# Patient Record
Sex: Female | Born: 1990 | Race: White | Hispanic: No | Marital: Single | Smoking: Never smoker
Health system: Southern US, Community
[De-identification: ages and names within clinical notes are randomized; demographics above are authoritative.]

## PROBLEM LIST (undated history)

## (undated) DIAGNOSIS — F419 Anxiety disorder, unspecified: Secondary | ICD-10-CM

## (undated) DIAGNOSIS — E282 Polycystic ovarian syndrome: Secondary | ICD-10-CM

## (undated) DIAGNOSIS — F329 Major depressive disorder, single episode, unspecified: Secondary | ICD-10-CM

## (undated) DIAGNOSIS — F32A Depression, unspecified: Secondary | ICD-10-CM

## (undated) DIAGNOSIS — E0590 Thyrotoxicosis, unspecified without thyrotoxic crisis or storm: Secondary | ICD-10-CM

## (undated) DIAGNOSIS — E039 Hypothyroidism, unspecified: Secondary | ICD-10-CM

## (undated) HISTORY — PX: ANKLE SURGERY: SHX546

## (undated) HISTORY — DX: Hypothyroidism, unspecified: E03.9

---
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
## 2018-08-06 NOTE — L&D Delivery Note (Signed)
Delivery Note Pt pushed well for 31mins and at 9:59 AM a viable female was delivered via Vaginal, Spontaneous (Presentation: LOA;  ).  APGAR: 9, 9; weight pending  .   Placenta status: delivered intact schultz , .  Cord: 3vc  with the following complications: none.  Cord pH: n/a  Anesthesia:  Epidural Episiotomy: None Lacerations: Periurethral Suture Repair: vicryl 4-0 Est. Blood Loss (mL): 390  Mom to postpartum.  Baby to Couplet care / Skin to Skin  Desires circumcision in office.  Nicole Dyer 05/31/2019, 10:19 AM

## 2018-08-26 DIAGNOSIS — T8484XA Pain due to internal orthopedic prosthetic devices, implants and grafts, initial encounter: Secondary | ICD-10-CM | POA: Insufficient documentation

## 2018-08-26 DIAGNOSIS — S82853D Displaced trimalleolar fracture of unspecified lower leg, subsequent encounter for closed fracture with routine healing: Secondary | ICD-10-CM | POA: Insufficient documentation

## 2018-12-01 LAB — OB RESULTS CONSOLE GC/CHLAMYDIA
Chlamydia: NEGATIVE
Gonorrhea: NEGATIVE

## 2018-12-01 LAB — OB RESULTS CONSOLE ANTIBODY SCREEN: Antibody Screen: NEGATIVE

## 2018-12-01 LAB — OB RESULTS CONSOLE ABO/RH: RH Type: POSITIVE

## 2018-12-01 LAB — OB RESULTS CONSOLE HEPATITIS B SURFACE ANTIGEN: Hepatitis B Surface Ag: NEGATIVE

## 2018-12-01 LAB — OB RESULTS CONSOLE RUBELLA ANTIBODY, IGM: Rubella: IMMUNE

## 2018-12-01 LAB — OB RESULTS CONSOLE RPR: RPR: NONREACTIVE

## 2018-12-01 LAB — OB RESULTS CONSOLE HIV ANTIBODY (ROUTINE TESTING): HIV: NONREACTIVE

## 2018-12-31 DIAGNOSIS — E282 Polycystic ovarian syndrome: Secondary | ICD-10-CM | POA: Diagnosis not present

## 2018-12-31 DIAGNOSIS — O99341 Other mental disorders complicating pregnancy, first trimester: Secondary | ICD-10-CM | POA: Diagnosis not present

## 2018-12-31 DIAGNOSIS — E039 Hypothyroidism, unspecified: Secondary | ICD-10-CM | POA: Diagnosis not present

## 2018-12-31 DIAGNOSIS — O99342 Other mental disorders complicating pregnancy, second trimester: Secondary | ICD-10-CM | POA: Diagnosis not present

## 2018-12-31 DIAGNOSIS — R87610 Atypical squamous cells of undetermined significance on cytologic smear of cervix (ASC-US): Secondary | ICD-10-CM | POA: Diagnosis not present

## 2018-12-31 DIAGNOSIS — Z3A18 18 weeks gestation of pregnancy: Secondary | ICD-10-CM | POA: Diagnosis not present

## 2019-01-01 DIAGNOSIS — F4323 Adjustment disorder with mixed anxiety and depressed mood: Secondary | ICD-10-CM | POA: Diagnosis not present

## 2019-01-08 DIAGNOSIS — F4323 Adjustment disorder with mixed anxiety and depressed mood: Secondary | ICD-10-CM | POA: Diagnosis not present

## 2019-01-08 DIAGNOSIS — Z363 Encounter for antenatal screening for malformations: Secondary | ICD-10-CM | POA: Diagnosis not present

## 2019-01-08 DIAGNOSIS — Z3A19 19 weeks gestation of pregnancy: Secondary | ICD-10-CM | POA: Diagnosis not present

## 2019-01-14 DIAGNOSIS — F410 Panic disorder [episodic paroxysmal anxiety] without agoraphobia: Secondary | ICD-10-CM | POA: Diagnosis not present

## 2019-01-23 DIAGNOSIS — F4323 Adjustment disorder with mixed anxiety and depressed mood: Secondary | ICD-10-CM | POA: Diagnosis not present

## 2019-01-29 DIAGNOSIS — F4323 Adjustment disorder with mixed anxiety and depressed mood: Secondary | ICD-10-CM | POA: Diagnosis not present

## 2019-02-04 DIAGNOSIS — Z362 Encounter for other antenatal screening follow-up: Secondary | ICD-10-CM | POA: Diagnosis not present

## 2019-02-04 DIAGNOSIS — Z3A23 23 weeks gestation of pregnancy: Secondary | ICD-10-CM | POA: Diagnosis not present

## 2019-02-13 DIAGNOSIS — F4323 Adjustment disorder with mixed anxiety and depressed mood: Secondary | ICD-10-CM | POA: Diagnosis not present

## 2019-02-26 DIAGNOSIS — F4323 Adjustment disorder with mixed anxiety and depressed mood: Secondary | ICD-10-CM | POA: Diagnosis not present

## 2019-03-04 DIAGNOSIS — Z23 Encounter for immunization: Secondary | ICD-10-CM | POA: Diagnosis not present

## 2019-03-04 DIAGNOSIS — Z3689 Encounter for other specified antenatal screening: Secondary | ICD-10-CM | POA: Diagnosis not present

## 2019-03-04 DIAGNOSIS — O99282 Endocrine, nutritional and metabolic diseases complicating pregnancy, second trimester: Secondary | ICD-10-CM | POA: Diagnosis not present

## 2019-03-04 DIAGNOSIS — Z3A27 27 weeks gestation of pregnancy: Secondary | ICD-10-CM | POA: Diagnosis not present

## 2019-03-04 DIAGNOSIS — O99342 Other mental disorders complicating pregnancy, second trimester: Secondary | ICD-10-CM | POA: Diagnosis not present

## 2019-03-11 DIAGNOSIS — F410 Panic disorder [episodic paroxysmal anxiety] without agoraphobia: Secondary | ICD-10-CM | POA: Diagnosis not present

## 2019-03-19 DIAGNOSIS — Z3A29 29 weeks gestation of pregnancy: Secondary | ICD-10-CM | POA: Diagnosis not present

## 2019-03-19 DIAGNOSIS — R5381 Other malaise: Secondary | ICD-10-CM | POA: Diagnosis not present

## 2019-03-19 DIAGNOSIS — Z3403 Encounter for supervision of normal first pregnancy, third trimester: Secondary | ICD-10-CM | POA: Diagnosis not present

## 2019-03-19 DIAGNOSIS — N898 Other specified noninflammatory disorders of vagina: Secondary | ICD-10-CM | POA: Diagnosis not present

## 2019-03-24 DIAGNOSIS — F410 Panic disorder [episodic paroxysmal anxiety] without agoraphobia: Secondary | ICD-10-CM | POA: Diagnosis not present

## 2019-03-31 DIAGNOSIS — F410 Panic disorder [episodic paroxysmal anxiety] without agoraphobia: Secondary | ICD-10-CM | POA: Diagnosis not present

## 2019-04-01 DIAGNOSIS — E041 Nontoxic single thyroid nodule: Secondary | ICD-10-CM | POA: Diagnosis not present

## 2019-04-01 DIAGNOSIS — R946 Abnormal results of thyroid function studies: Secondary | ICD-10-CM | POA: Diagnosis not present

## 2019-04-02 DIAGNOSIS — E042 Nontoxic multinodular goiter: Secondary | ICD-10-CM | POA: Diagnosis not present

## 2019-04-06 DIAGNOSIS — F410 Panic disorder [episodic paroxysmal anxiety] without agoraphobia: Secondary | ICD-10-CM | POA: Diagnosis not present

## 2019-04-13 DIAGNOSIS — F410 Panic disorder [episodic paroxysmal anxiety] without agoraphobia: Secondary | ICD-10-CM | POA: Diagnosis not present

## 2019-04-22 DIAGNOSIS — F410 Panic disorder [episodic paroxysmal anxiety] without agoraphobia: Secondary | ICD-10-CM | POA: Diagnosis not present

## 2019-04-29 DIAGNOSIS — F410 Panic disorder [episodic paroxysmal anxiety] without agoraphobia: Secondary | ICD-10-CM | POA: Diagnosis not present

## 2019-05-04 ENCOUNTER — Encounter (HOSPITAL_COMMUNITY): Payer: Self-pay | Admitting: *Deleted

## 2019-05-04 ENCOUNTER — Inpatient Hospital Stay (HOSPITAL_COMMUNITY)
Admission: AD | Admit: 2019-05-04 | Discharge: 2019-05-04 | Disposition: A | Discharge status: Home / Self Care | Payer: Medicaid Other | Attending: Obstetrics and Gynecology | Admitting: Obstetrics and Gynecology

## 2019-05-04 ENCOUNTER — Other Ambulatory Visit: Payer: Self-pay

## 2019-05-04 DIAGNOSIS — R197 Diarrhea, unspecified: Secondary | ICD-10-CM

## 2019-05-04 DIAGNOSIS — Z711 Person with feared health complaint in whom no diagnosis is made: Secondary | ICD-10-CM | POA: Diagnosis not present

## 2019-05-04 DIAGNOSIS — O219 Vomiting of pregnancy, unspecified: Secondary | ICD-10-CM

## 2019-05-04 DIAGNOSIS — O212 Late vomiting of pregnancy: Secondary | ICD-10-CM | POA: Insufficient documentation

## 2019-05-04 DIAGNOSIS — Z3A35 35 weeks gestation of pregnancy: Secondary | ICD-10-CM | POA: Diagnosis not present

## 2019-05-04 DIAGNOSIS — M7989 Other specified soft tissue disorders: Secondary | ICD-10-CM | POA: Diagnosis not present

## 2019-05-04 DIAGNOSIS — O9989 Other specified diseases and conditions complicating pregnancy, childbirth and the puerperium: Secondary | ICD-10-CM | POA: Diagnosis not present

## 2019-05-04 DIAGNOSIS — O26893 Other specified pregnancy related conditions, third trimester: Secondary | ICD-10-CM | POA: Diagnosis not present

## 2019-05-04 DIAGNOSIS — O1203 Gestational edema, third trimester: Secondary | ICD-10-CM

## 2019-05-04 HISTORY — DX: Anxiety disorder, unspecified: F41.9

## 2019-05-04 HISTORY — DX: Depression, unspecified: F32.A

## 2019-05-04 HISTORY — DX: Polycystic ovarian syndrome: E28.2

## 2019-05-04 LAB — URINALYSIS, ROUTINE W REFLEX MICROSCOPIC
Bilirubin Urine: NEGATIVE
Glucose, UA: NEGATIVE mg/dL
Hgb urine dipstick: NEGATIVE
Ketones, ur: NEGATIVE mg/dL
Nitrite: NEGATIVE
Protein, ur: NEGATIVE mg/dL
Specific Gravity, Urine: 1.013 (ref 1.005–1.030)
pH: 7 (ref 5.0–8.0)

## 2019-05-04 MED ORDER — PROMETHAZINE HCL 25 MG PO TABS: 25.0000 mg | ORAL_TABLET | Freq: Four times a day (QID) | ORAL | 1 refills | Status: DC | PRN

## 2019-05-04 NOTE — Discharge Instructions (Signed)

## 2019-05-04 NOTE — MAU Note (Signed)
Unable to keep food down that she was able to keep down before. Throwing up between 430-530am this morning.

## 2019-05-04 NOTE — MAU Provider Note (Signed)
History     CSN: [id number]  Arrival date and time: 05/04/19 1630   First Provider Initiated Contact with Patient 05/04/19 1819      Chief Complaint  Patient presents with  . Foot Swelling  . Emesis   HPI   Ms.Nicole Dyer is a 28 y.o.female G1P0 @[redacted]w[redacted]d  here with a month of increased in N/V. Says several times over the last month she has vomited everything up first thing in the morning. She had 1 episode of diarrhea today and 1 episode of diarrhea on Saturday. No fever, no vaginal bleeding.  Has taken phenergan in the past for the nausea however she ran out. This is her first baby and at times she is unsure what is normal and what is concerning.   OB History    Gravida  1   Para      Term      Preterm      AB      Living        SAB      TAB      Ectopic      Multiple      Live Births              Past Medical History:  Diagnosis Date  . Anxiety   . Depression   . PCOS (polycystic ovarian syndrome)     Past Surgical History:  Procedure Laterality Date  . ANKLE SURGERY Left     Family History  Problem Relation Age of Onset  . Cancer Mother     Social History   Tobacco Use  . Smoking status: Never Smoker  . Smokeless tobacco: Never Used  Substance Use Topics  . Alcohol use: Never    Frequency: Never  . Drug use: Yes    Frequency: 3.0 times per week    Types: Marijuana    Allergies:  Allergies  Allergen Reactions  . Bee Venom Swelling  . Poison Ivy Extract Swelling    No medications prior to admission.   Results for orders placed or performed during the hospital encounter of 05/04/19 (from the past 48 hour(s))  Urinalysis, Routine w reflex microscopic     Status: Abnormal   Collection Time: 05/04/19  5:05 PM  Result Value Ref Range   Color, Urine YELLOW YELLOW   APPearance CLEAR CLEAR   Specific Gravity, Urine 1.013 1.005 - 1.030   pH 7.0 5.0 - 8.0   Glucose, UA NEGATIVE NEGATIVE mg/dL   Hgb urine dipstick NEGATIVE  NEGATIVE   Bilirubin Urine NEGATIVE NEGATIVE   Ketones, ur NEGATIVE NEGATIVE mg/dL   Protein, ur NEGATIVE NEGATIVE mg/dL   Nitrite NEGATIVE NEGATIVE   Leukocytes,Ua MODERATE (A) NEGATIVE   RBC / HPF 0-5 0 - 5 RBC/hpf   WBC, UA 0-5 0 - 5 WBC/hpf   Bacteria, UA RARE (A) NONE SEEN   Squamous Epithelial / LPF 0-5 0 - 5   Mucus PRESENT     Comment: Performed at [facility], [address], [place], [place]    Review of Systems  Constitutional: Negative for fever.  Gastrointestinal: Positive for nausea (None now) and vomiting. Negative for abdominal pain.   Physical Exam   Blood pressure (!) 102/58, pulse 89, temperature 98.2 F (36.8 C), temperature source Oral, resp. rate 18, height 5\' 10"  (1.778 m), weight 103 kg, SpO2 99 %.  Physical Exam  Constitutional: She is oriented to person, place, and time. She appears well-developed and well-nourished.  No distress.  HENT:  Head: Normocephalic.  Musculoskeletal: Normal range of motion.        General: Edema (Mild swelling +1 in bilateral lower extermities. ) present.  Neurological: She is alert and oriented to person, place, and time. She has normal reflexes. She displays normal reflexes.  Negative clonus   Skin: She is not diaphoretic.  Psychiatric: Her behavior is normal.   Fetal Tracing: Baseline: 120 bpm Variability: Moderate  Accelerations: 15x15 Decelerations: None Toco: None  MAU Course  Procedures  None  MDM  Well appearing female. BP normal. Mild swelling in extremities.   Assessment and Plan   A:  1. Nausea and vomiting in pregnancy   2. Physically well but worried   3. [redacted] weeks gestation of pregnancy   4. Diarrhea, unspecified type   5. Edema during pregnancy in third trimester     P:  Discharge home in stable condition Small, frequent meals Elevate feet at night Increase oral fluid intake Urine culture pending Rx: Phenergan F/u with OB  Lezlie Lye, NP 05/04/2019 8:17  PM

## 2019-05-04 NOTE — MAU Note (Signed)
Last 3 or 4 days, has noted an increase in swelling.  Waking up with swelling. Threw up yesterday morning after breakfast.  Started throwing up during the night, not since this morning, has been eating light since then.  Has been having loose stools last couple days. Called office, sent in for eval.  Denies HA, has been having floaters past few wks, denies epigastric pain.   Noted her tongue was yellow 2 wks ago.  Noted white spots on her arms.

## 2019-05-05 DIAGNOSIS — F410 Panic disorder [episodic paroxysmal anxiety] without agoraphobia: Secondary | ICD-10-CM | POA: Diagnosis not present

## 2019-05-06 LAB — CULTURE, OB URINE: Special Requests: NORMAL

## 2019-05-11 DIAGNOSIS — Z3A36 36 weeks gestation of pregnancy: Secondary | ICD-10-CM | POA: Diagnosis not present

## 2019-05-11 DIAGNOSIS — E079 Disorder of thyroid, unspecified: Secondary | ICD-10-CM | POA: Diagnosis not present

## 2019-05-11 DIAGNOSIS — Z3685 Encounter for antenatal screening for Streptococcus B: Secondary | ICD-10-CM | POA: Diagnosis not present

## 2019-05-11 DIAGNOSIS — O99343 Other mental disorders complicating pregnancy, third trimester: Secondary | ICD-10-CM | POA: Diagnosis not present

## 2019-05-11 DIAGNOSIS — E282 Polycystic ovarian syndrome: Secondary | ICD-10-CM | POA: Diagnosis not present

## 2019-05-11 LAB — OB RESULTS CONSOLE GBS: GBS: POSITIVE

## 2019-05-13 DIAGNOSIS — F410 Panic disorder [episodic paroxysmal anxiety] without agoraphobia: Secondary | ICD-10-CM | POA: Diagnosis not present

## 2019-05-20 DIAGNOSIS — O1200 Gestational edema, unspecified trimester: Secondary | ICD-10-CM | POA: Diagnosis not present

## 2019-05-27 DIAGNOSIS — F410 Panic disorder [episodic paroxysmal anxiety] without agoraphobia: Secondary | ICD-10-CM | POA: Diagnosis not present

## 2019-05-28 ENCOUNTER — Telehealth (HOSPITAL_COMMUNITY): Payer: Self-pay | Admitting: *Deleted

## 2019-05-28 ENCOUNTER — Encounter (HOSPITAL_COMMUNITY): Payer: Self-pay | Admitting: *Deleted

## 2019-05-28 NOTE — Telephone Encounter (Signed)
Preadmission screen  

## 2019-05-30 ENCOUNTER — Inpatient Hospital Stay (HOSPITAL_COMMUNITY)
Admission: AD | Admit: 2019-05-30 | Discharge: 2019-06-02 | DRG: 807 | Disposition: A | Discharge status: Home / Self Care | Payer: Medicaid Other | Attending: Obstetrics and Gynecology | Admitting: Obstetrics and Gynecology

## 2019-05-30 ENCOUNTER — Other Ambulatory Visit: Payer: Self-pay

## 2019-05-30 ENCOUNTER — Encounter (HOSPITAL_COMMUNITY): Payer: Self-pay | Admitting: *Deleted

## 2019-05-30 DIAGNOSIS — O99820 Streptococcus B carrier state complicating pregnancy: Principal | ICD-10-CM | POA: Diagnosis present

## 2019-05-30 DIAGNOSIS — Z3A39 39 weeks gestation of pregnancy: Secondary | ICD-10-CM

## 2019-05-30 DIAGNOSIS — Z23 Encounter for immunization: Secondary | ICD-10-CM

## 2019-05-30 DIAGNOSIS — Z141 Cystic fibrosis carrier: Secondary | ICD-10-CM

## 2019-05-30 DIAGNOSIS — Z20828 Contact with and (suspected) exposure to other viral communicable diseases: Secondary | ICD-10-CM | POA: Diagnosis present

## 2019-05-30 MED ORDER — LACTATED RINGERS IV SOLN
500.0000 mL | INTRAVENOUS | Status: DC | PRN
  Administered 2019-05-30: 1000 mL via INTRAVENOUS

## 2019-05-30 NOTE — MAU Note (Signed)
Pt reports to MAU c/o SROM @1900 . Pt states her ctxs are 6-7 min apart. +FM. Pt reports a pink tinge to her amniotic fluid.

## 2019-05-31 ENCOUNTER — Inpatient Hospital Stay (HOSPITAL_COMMUNITY): Payer: Medicaid Other | Admitting: Anesthesiology

## 2019-05-31 ENCOUNTER — Encounter (HOSPITAL_COMMUNITY): Payer: Self-pay

## 2019-05-31 DIAGNOSIS — O26893 Other specified pregnancy related conditions, third trimester: Secondary | ICD-10-CM | POA: Diagnosis not present

## 2019-05-31 DIAGNOSIS — Z3A39 39 weeks gestation of pregnancy: Secondary | ICD-10-CM | POA: Diagnosis not present

## 2019-05-31 DIAGNOSIS — O411230 Chorioamnionitis, third trimester, not applicable or unspecified: Secondary | ICD-10-CM | POA: Diagnosis not present

## 2019-05-31 DIAGNOSIS — O99284 Endocrine, nutritional and metabolic diseases complicating childbirth: Secondary | ICD-10-CM | POA: Diagnosis not present

## 2019-05-31 DIAGNOSIS — Z23 Encounter for immunization: Secondary | ICD-10-CM | POA: Diagnosis not present

## 2019-05-31 DIAGNOSIS — Z3A00 Weeks of gestation of pregnancy not specified: Secondary | ICD-10-CM | POA: Diagnosis not present

## 2019-05-31 DIAGNOSIS — Z141 Cystic fibrosis carrier: Secondary | ICD-10-CM | POA: Diagnosis not present

## 2019-05-31 DIAGNOSIS — E039 Hypothyroidism, unspecified: Secondary | ICD-10-CM | POA: Diagnosis not present

## 2019-05-31 DIAGNOSIS — O99820 Streptococcus B carrier state complicating pregnancy: Secondary | ICD-10-CM | POA: Diagnosis not present

## 2019-05-31 DIAGNOSIS — Z20828 Contact with and (suspected) exposure to other viral communicable diseases: Secondary | ICD-10-CM | POA: Diagnosis not present

## 2019-05-31 LAB — CBC
HCT: 33.9 % — ABNORMAL LOW (ref 36.0–46.0)
Hemoglobin: 11.3 g/dL — ABNORMAL LOW (ref 12.0–15.0)
MCH: 29.4 pg (ref 26.0–34.0)
MCHC: 33.3 g/dL (ref 30.0–36.0)
MCV: 88.3 fL (ref 80.0–100.0)
Platelets: 236 10*3/uL (ref 150–400)
RBC: 3.84 MIL/uL — ABNORMAL LOW (ref 3.87–5.11)
RDW: 13.3 % (ref 11.5–15.5)
WBC: 12.3 10*3/uL — ABNORMAL HIGH (ref 4.0–10.5)
nRBC: 0 % (ref 0.0–0.2)

## 2019-05-31 LAB — ABO/RH: ABO/RH(D): O POS

## 2019-05-31 LAB — TYPE AND SCREEN
ABO/RH(D): O POS
Antibody Screen: NEGATIVE

## 2019-05-31 LAB — SARS CORONAVIRUS 2 BY RT PCR (HOSPITAL ORDER, PERFORMED IN ~~LOC~~ HOSPITAL LAB): SARS Coronavirus 2: NEGATIVE

## 2019-05-31 LAB — RPR: RPR Ser Ql: NONREACTIVE

## 2019-05-31 MED ORDER — BUTORPHANOL TARTRATE 1 MG/ML IJ SOLN
1.0000 mg | INTRAMUSCULAR | Status: DC | PRN
  Administered 2019-05-31: 03:00:00 1 mg via INTRAVENOUS

## 2019-05-31 MED ORDER — EPHEDRINE 5 MG/ML INJ: 10.0000 mg | INTRAVENOUS | Status: DC | PRN

## 2019-05-31 MED ORDER — OXYCODONE HCL 5 MG PO TABS: 5.0000 mg | ORAL_TABLET | ORAL | Status: DC | PRN

## 2019-05-31 MED ORDER — TETANUS-DIPHTH-ACELL PERTUSSIS 5-2.5-18.5 LF-MCG/0.5 IM SUSP: 0.5000 mL | Freq: Once | INTRAMUSCULAR | Status: DC

## 2019-05-31 MED ORDER — ONDANSETRON HCL 4 MG/2ML IJ SOLN: 4.0000 mg | INTRAMUSCULAR | Status: DC | PRN

## 2019-05-31 MED ORDER — SENNOSIDES-DOCUSATE SODIUM 8.6-50 MG PO TABS
2.0000 | ORAL_TABLET | ORAL | Status: DC
  Administered 2019-06-01 (×2): 2 via ORAL
  Filled 2019-05-31 (×2): qty 2

## 2019-05-31 MED ORDER — DIPHENHYDRAMINE HCL 25 MG PO CAPS: 25.0000 mg | ORAL_CAPSULE | Freq: Four times a day (QID) | ORAL | Status: DC | PRN

## 2019-05-31 MED ORDER — LIDOCAINE HCL (PF) 1 % IJ SOLN
INTRAMUSCULAR | Status: DC | PRN
  Administered 2019-05-31 (×2): 6 mL via EPIDURAL

## 2019-05-31 MED ORDER — LACTATED RINGERS IV SOLN
500.0000 mL | Freq: Once | INTRAVENOUS | Status: AC
  Administered 2019-05-31: 500 mL via INTRAVENOUS

## 2019-05-31 MED ORDER — OXYCODONE-ACETAMINOPHEN 5-325 MG PO TABS: 2.0000 | ORAL_TABLET | ORAL | Status: DC | PRN

## 2019-05-31 MED ORDER — FLEET ENEMA 7-19 GM/118ML RE ENEM: 1.0000 | ENEMA | RECTAL | Status: DC | PRN

## 2019-05-31 MED ORDER — LACTATED RINGERS IV SOLN
INTRAVENOUS | Status: DC
  Administered 2019-05-31: via INTRAVENOUS

## 2019-05-31 MED ORDER — WITCH HAZEL-GLYCERIN EX PADS
1.0000 "application " | MEDICATED_PAD | CUTANEOUS | Status: DC | PRN
  Administered 2019-06-01: 1 via TOPICAL

## 2019-05-31 MED ORDER — INFLUENZA VAC SPLIT QUAD 0.5 ML IM SUSY
0.5000 mL | PREFILLED_SYRINGE | INTRAMUSCULAR | Status: AC
  Administered 2019-06-01: 09:00:00 0.5 mL via INTRAMUSCULAR
  Filled 2019-05-31: qty 0.5

## 2019-05-31 MED ORDER — COCONUT OIL OIL
1.0000 "application " | TOPICAL_OIL | Status: DC | PRN
  Administered 2019-06-02: 1 via TOPICAL

## 2019-05-31 MED ORDER — ACETAMINOPHEN 325 MG PO TABS
650.0000 mg | ORAL_TABLET | ORAL | Status: DC | PRN
  Administered 2019-05-31: 650 mg via ORAL
  Filled 2019-05-31: qty 2

## 2019-05-31 MED ORDER — LIDOCAINE HCL (PF) 1 % IJ SOLN: 30.0000 mL | INTRAMUSCULAR | Status: DC | PRN

## 2019-05-31 MED ORDER — DIPHENHYDRAMINE HCL 50 MG/ML IJ SOLN: 12.5000 mg | INTRAMUSCULAR | Status: DC | PRN

## 2019-05-31 MED ORDER — OXYTOCIN BOLUS FROM INFUSION
500.0000 mL | Freq: Once | INTRAVENOUS | Status: AC
  Administered 2019-05-31: 500 mL via INTRAVENOUS

## 2019-05-31 MED ORDER — ONDANSETRON HCL 4 MG PO TABS: 4.0000 mg | ORAL_TABLET | ORAL | Status: DC | PRN

## 2019-05-31 MED ORDER — FENTANYL-BUPIVACAINE-NACL 0.5-0.125-0.9 MG/250ML-% EP SOLN
12.0000 mL/h | EPIDURAL | Status: DC | PRN
  Filled 2019-05-31: qty 250

## 2019-05-31 MED ORDER — DIBUCAINE (PERIANAL) 1 % EX OINT: 1.0000 "application " | TOPICAL_OINTMENT | CUTANEOUS | Status: DC | PRN

## 2019-05-31 MED ORDER — SODIUM CHLORIDE 0.9 % IV SOLN
5.0000 10*6.[IU] | Freq: Once | INTRAVENOUS | Status: AC
  Administered 2019-05-31: 02:00:00 5 10*6.[IU] via INTRAVENOUS
  Filled 2019-05-31: qty 5

## 2019-05-31 MED ORDER — OXYCODONE-ACETAMINOPHEN 5-325 MG PO TABS: 1.0000 | ORAL_TABLET | ORAL | Status: DC | PRN

## 2019-05-31 MED ORDER — SIMETHICONE 80 MG PO CHEW: 80.0000 mg | CHEWABLE_TABLET | ORAL | Status: DC | PRN

## 2019-05-31 MED ORDER — SODIUM CHLORIDE (PF) 0.9 % IJ SOLN
INTRAMUSCULAR | Status: DC | PRN
  Administered 2019-05-31: 12 mL/h via EPIDURAL

## 2019-05-31 MED ORDER — PENICILLIN G 3 MILLION UNITS IVPB - SIMPLE MED
3.0000 10*6.[IU] | INTRAVENOUS | Status: DC
  Administered 2019-05-31 (×2): 3 10*6.[IU] via INTRAVENOUS
  Filled 2019-05-31 (×2): qty 100

## 2019-05-31 MED ORDER — IBUPROFEN 600 MG PO TABS
600.0000 mg | ORAL_TABLET | Freq: Four times a day (QID) | ORAL | Status: DC
  Administered 2019-05-31 – 2019-06-02 (×8): 600 mg via ORAL
  Filled 2019-05-31 (×8): qty 1

## 2019-05-31 MED ORDER — ONDANSETRON HCL 4 MG/2ML IJ SOLN: 4.0000 mg | Freq: Four times a day (QID) | INTRAMUSCULAR | Status: DC | PRN

## 2019-05-31 MED ORDER — PHENYLEPHRINE 40 MCG/ML (10ML) SYRINGE FOR IV PUSH (FOR BLOOD PRESSURE SUPPORT)
80.0000 ug | PREFILLED_SYRINGE | INTRAVENOUS | Status: DC | PRN
  Filled 2019-05-31: qty 10

## 2019-05-31 MED ORDER — SOD CITRATE-CITRIC ACID 500-334 MG/5ML PO SOLN
30.0000 mL | ORAL | Status: DC | PRN
  Administered 2019-05-31: 30 mL via ORAL
  Filled 2019-05-31: qty 30

## 2019-05-31 MED ORDER — BUTORPHANOL TARTRATE 1 MG/ML IJ SOLN
INTRAMUSCULAR | Status: AC
  Administered 2019-05-31: 1 mg via INTRAVENOUS
  Filled 2019-05-31: qty 1

## 2019-05-31 MED ORDER — PRENATAL MULTIVITAMIN CH
1.0000 | ORAL_TABLET | Freq: Every day | ORAL | Status: DC
  Administered 2019-06-01 – 2019-06-02 (×2): 1 via ORAL
  Filled 2019-05-31 (×2): qty 1

## 2019-05-31 MED ORDER — ACETAMINOPHEN 325 MG PO TABS: 650.0000 mg | ORAL_TABLET | ORAL | Status: DC | PRN

## 2019-05-31 MED ORDER — OXYTOCIN 40 UNITS IN NORMAL SALINE INFUSION - SIMPLE MED
2.5000 [IU]/h | INTRAVENOUS | Status: DC
  Filled 2019-05-31: qty 1000

## 2019-05-31 MED ORDER — OXYTOCIN 40 UNITS IN NORMAL SALINE INFUSION - SIMPLE MED
1.0000 m[IU]/min | INTRAVENOUS | Status: DC
  Administered 2019-05-31: 06:00:00 2 m[IU]/min via INTRAVENOUS

## 2019-05-31 MED ORDER — TERBUTALINE SULFATE 1 MG/ML IJ SOLN: 0.2500 mg | Freq: Once | INTRAMUSCULAR | Status: DC | PRN

## 2019-05-31 MED ORDER — ZOLPIDEM TARTRATE 5 MG PO TABS: 5.0000 mg | ORAL_TABLET | Freq: Every evening | ORAL | Status: DC | PRN

## 2019-05-31 MED ORDER — BENZOCAINE-MENTHOL 20-0.5 % EX AERO
1.0000 "application " | INHALATION_SPRAY | CUTANEOUS | Status: DC | PRN
  Administered 2019-05-31: 1 via TOPICAL
  Filled 2019-05-31: qty 56

## 2019-05-31 MED ORDER — OXYCODONE HCL 5 MG PO TABS: 10.0000 mg | ORAL_TABLET | ORAL | Status: DC | PRN

## 2019-05-31 MED ORDER — PHENYLEPHRINE 40 MCG/ML (10ML) SYRINGE FOR IV PUSH (FOR BLOOD PRESSURE SUPPORT): 80.0000 ug | PREFILLED_SYRINGE | INTRAVENOUS | Status: DC | PRN

## 2019-05-31 NOTE — H&P (Signed)
Nicole Dyer is a 28 y.o.prime female who presented with spontaneous ruptured membranes at 26 5/7wks. Pt was having some contractions as well - 1-2 cm dilated. Pt is dated per LMP, confirmed with a 13 week [place]. She has a hisotry of thyroid disease - no meds needed in pregnancy- saw endocrinologist. She has been seeing a counselor for anxiety during pregnancy. She is GBS+. She was CF + OB History    Gravida  1   Para      Term      Preterm      AB      Living        SAB      TAB      Ectopic      Multiple      Live Births             Past Medical History:  Diagnosis Date  . Anxiety   . Depression   . Hypothyroidism   . PCOS (polycystic ovarian syndrome)    Past Surgical History:  Procedure Laterality Date  . ANKLE SURGERY Left    Family History: family history includes Cancer in her mother. Social History:  reports that she has never smoked. She has never used smokeless tobacco. She reports current drug use. Frequency: 3.00 times per week. Drug: Marijuana. She reports that she does not drink alcohol.     Maternal Diabetes: No Genetic Screening: Abnormal:  Results: Other: CF + Maternal Ultrasounds/Referrals: Normal Fetal Ultrasounds or other Referrals:  None Maternal Substance Abuse:  No Significant Maternal Medications:  None Significant Maternal Lab Results:  Group B Strep positive Other Comments:  None  Review of Systems  Constitutional: Negative for chills, fever, malaise/fatigue and weight loss.  Eyes: Negative for blurred vision and double vision.  Respiratory: Negative for shortness of breath.   Cardiovascular: Negative for chest pain and palpitations.  Gastrointestinal: Positive for abdominal pain. Negative for heartburn, nausea and vomiting.  Genitourinary: Negative for dysuria.  Musculoskeletal: Negative for myalgias.  Skin: Negative for itching and rash.  Neurological: Negative for dizziness and headaches.  Endo/Heme/Allergies: Negative for  environmental allergies. Does not bruise/bleed easily.  Psychiatric/Behavioral: Negative for depression, hallucinations, substance abuse and suicidal ideas. The patient is not nervous/anxious.    Maternal Medical History:  Reason for admission: Rupture of membranes and contractions.  Nausea.  Contractions: Onset was 6-12 hours ago.   Frequency: regular.   Perceived severity is strong.    Fetal activity: Perceived fetal activity is normal.   Last perceived fetal movement was within the past hour.    Prenatal complications: no prenatal complications Prenatal Complications - Diabetes: none.    Dilation: 4 Effacement (%): 80 Station: -1 Exam by:: Meghan Rice, RN Blood pressure 110/67, pulse 88, temperature 99.1 F (37.3 C), temperature source Oral, resp. rate 16, height 5\' 10"  (1.778 m), weight 106 kg, SpO2 98 %. Maternal Exam:  Uterine Assessment: Contraction strength is moderate.  Contraction frequency is regular.   Abdomen: Patient reports generalized tenderness.  Estimated fetal weight is AGA.   Fetal presentation: vertex  Introitus: Vulva is negative for condylomata and lesion.  Vagina is negative for condylomata.  Amniotic fluid character: clear.  Pelvis: adequate for delivery.   Cervix: Cervix evaluated by digital exam.     Fetal Exam Fetal Monitor Review: Baseline rate: 120.  Variability: moderate (6-25 bpm).   Pattern: accelerations present.    Fetal State Assessment: Category I - tracings are normal.     Physical  Exam  Constitutional: She is oriented to person, place, and time. She appears well-developed and well-nourished.  Neck: Normal range of motion.  Cardiovascular: Normal rate.  Respiratory: Effort normal.  GI: Soft. There is generalized abdominal tenderness.  Genitourinary:    Vagina and uterus normal.     No vulval condylomata or lesion noted.   Musculoskeletal: Normal range of motion.  Neurological: She is alert and oriented to person, place,  and time.  Skin: Skin is warm.  Psychiatric: She has a normal mood and affect. Her behavior is normal. Judgment and thought content normal.    Prenatal labs: ABO, Rh: --/--/O POS, O POS Performed at [facility], [address] [place], [place] [address]  [phone]) Antibody: NEG (10/24 2351) Rubella: Immune (04/27 0000) RPR: Nonreactive (04/27 0000)  HBsAg: Negative (04/27 0000)  HIV: Non-reactive (04/27 0000)  GBS: Positive/-- (10/05 0000)   Assessment/Plan: Prime at 69 5/7wks with SROM and active labor Admit Labor augmentation with pitocin  Epidural prn prn GBS - treated with PCN Pt now complete - will attempt pushing   Venetia Night Banga 05/31/2019, 9:05 AM

## 2019-05-31 NOTE — Anesthesia Procedure Notes (Signed)
Epidural Patient location during procedure: OB Start time: 05/31/2019 4:31 AM End time: 05/31/2019 4:34 AM  Staffing Anesthesiologist: Lyn Hollingshead, MD Performed: anesthesiologist   Preanesthetic Checklist Completed: patient identified, site marked, surgical consent, pre-op evaluation, timeout performed, IV checked, risks and benefits discussed and monitors and equipment checked  Epidural Patient position: sitting Prep: site prepped and draped and DuraPrep Patient monitoring: continuous pulse ox and blood pressure Approach: midline Location: L3-L4 Injection technique: LOR air  Needle:  Needle type: Tuohy  Needle gauge: 17 G Needle length: 9 cm and 9 Needle insertion depth: 6 cm Catheter type: closed end flexible Catheter size: 19 Gauge Catheter at skin depth: 11 cm Test dose: negative and Other  Assessment Events: blood not aspirated, injection not painful, no injection resistance, negative IV test and no paresthesia  Additional Notes Reason for block:procedure for pain

## 2019-05-31 NOTE — Progress Notes (Signed)
Patient ID: Nicole Dyer, female   DOB: 1991/04/04, 28 y.o.   MRN: [id number] Notified pt with temp of 100.7 during recovery  Pt asymptomatic  Will give a dose of tylenol now and request placenta to pathology.   Monitor for now If persists will consider antibiotic treatment

## 2019-05-31 NOTE — Anesthesia Postprocedure Evaluation (Signed)
Anesthesia Post Note  Patient: Nicole Dyer  Procedure(s) Performed: AN AD [place]     Patient location during evaluation: Mother Baby Anesthesia Type: Epidural Level of consciousness: awake and alert Pain management: pain level controlled Vital Signs Assessment: post-procedure vital signs reviewed and stable Respiratory status: spontaneous breathing, nonlabored ventilation and respiratory function stable Cardiovascular status: stable Postop Assessment: no headache, no backache and epidural receding Anesthetic complications: no    Last Vitals:  Vitals:   05/31/19 1236 05/31/19 1335  BP: 108/66 117/70  Pulse: 86 87  Resp: 20 19  Temp: 37.3 C 37.2 C  SpO2: 98% 99%    Last Pain:  Vitals:   05/31/19 1511  TempSrc:   PainSc: 3    Pain Goal:                   [name]

## 2019-05-31 NOTE — Anesthesia Preprocedure Evaluation (Signed)
Anesthesia Evaluation  Patient identified by MRN, date of birth, ID band Patient awake    Reviewed: Allergy & Precautions, H&P , NPO status , Patient's Chart, lab work & pertinent test results  Airway Mallampati: I  TM Distance: >3 FB Neck ROM: full    Dental no notable dental hx. (+) Teeth Intact   Pulmonary neg pulmonary ROS,    Pulmonary exam normal breath sounds clear to auscultation       Cardiovascular negative cardio ROS Normal cardiovascular exam Rhythm:regular Rate:Normal     Neuro/Psych PSYCHIATRIC DISORDERS Anxiety Depression negative neurological ROS     GI/Hepatic negative GI ROS, Neg liver ROS,   Endo/Other  Hypothyroidism   Renal/GU negative Renal ROS  negative genitourinary   Musculoskeletal   Abdominal (+) + obese,   Peds  Hematology negative hematology ROS (+)   Anesthesia Other Findings   Reproductive/Obstetrics (+) Pregnancy                             Anesthesia Physical Anesthesia Plan  ASA: II  Anesthesia Plan: Epidural   Post-op Pain Management:    Induction:   PONV Risk Score and Plan:   Airway Management Planned:   Additional Equipment:   Intra-op Plan:   Post-operative Plan:   Informed Consent: I have reviewed the patients History and Physical, chart, labs and discussed the procedure including the risks, benefits and alternatives for the proposed anesthesia with the patient or authorized representative who has indicated his/her understanding and acceptance.       Plan Discussed with:   Anesthesia Plan Comments:         Anesthesia Quick Evaluation

## 2019-05-31 NOTE — Lactation Note (Signed)
This note was copied from a baby's chart. Lactation Consultation Note  Patient Name: Nicole Dyer [id number] Date: 05/31/2019 Reason for consult: Initial assessment;Primapara;Term Randel Books is 4 hours old and has been to the breast twice.  Last latch score 9.  Mom knows how to hand express and has an abundant supply.  Baby is currently sleeping in crib.  Mom is eating lunch.  Instructed to feed with feeding cues.  Skin to skin recommended to encourage feedings.  Breastfeeding consultation services information given  Maternal Data Has patient been taught Hand Expression?: Yes Does the patient have breastfeeding experience prior to this delivery?: No  Feeding Feeding Type: Breast Fed  LATCH Score Latch: Grasps breast easily, tongue down, lips flanged, rhythmical sucking.  Audible Swallowing: A few with stimulation  Type of Nipple: Everted at rest and after stimulation  Comfort (Breast/Nipple): Soft / non-tender  Hold (Positioning): No assistance needed to correctly position infant at breast.  LATCH Score: 9  Interventions    Lactation Tools Discussed/Used     Consult Status Consult Status: Follow-up Date: 06/01/19 Follow-up type: In-patient    Ave Filter 05/31/2019, 2:36 PM

## 2019-06-01 ENCOUNTER — Other Ambulatory Visit (HOSPITAL_COMMUNITY): Payer: Medicaid Other | Attending: Obstetrics and Gynecology

## 2019-06-01 LAB — CBC
HCT: 31.9 % — ABNORMAL LOW (ref 36.0–46.0)
Hemoglobin: 10.5 g/dL — ABNORMAL LOW (ref 12.0–15.0)
MCH: 29.7 pg (ref 26.0–34.0)
MCHC: 32.9 g/dL (ref 30.0–36.0)
MCV: 90.4 fL (ref 80.0–100.0)
Platelets: 191 10*3/uL (ref 150–400)
RBC: 3.53 MIL/uL — ABNORMAL LOW (ref 3.87–5.11)
RDW: 13.5 % (ref 11.5–15.5)
WBC: 10.7 10*3/uL — ABNORMAL HIGH (ref 4.0–10.5)
nRBC: 0 % (ref 0.0–0.2)

## 2019-06-01 NOTE — Clinical Social Work Maternal (Signed)
CLINICAL SOCIAL WORK MATERNAL/CHILD NOTE  Patient Details  Name: Dynasti Kerman MRN: [id number] Date of Birth: 1991-01-02  Date:  06/01/2019  Clinical Social Worker Initiating Note:  Hortencia Pilar, LCSW Date/Time: Initiated:  06/01/19/0932     Child's Name:  Janalyn Harder   Biological Parents:  Mother, Father   Need for Interpreter:  None   Reason for Referral:  Current Substance Use/Substance Use During Pregnancy , Behavioral Health Concerns   Address:  [address] [place] [address]    Phone number:  [phone] (home)     Additional phone number: none   Household Members/Support Persons (HM/SP):       HM/SP Name Relationship DOB or Age  HM/SP -1        HM/SP -2        HM/SP -3        HM/SP -4        HM/SP -5        HM/SP -6        HM/SP -7        HM/SP -8          Natural Supports (not living in the home):  Friends   Professional Supports: Therapist   Employment: Part-time   Type of Work: [employer]   Education:  Other (comment)(Associate Degree)   Homebound arranged:  n/a  Surveyor, quantity Resources:  Medicaid   Other Resources:  Sales executive , WIC   Cultural/Religious Considerations Which May Impact Care:  none reported   Strengths:  Ability to meet basic needs , Pediatrician chosen, Compliance with medical plan , Home prepared for child    Psychotropic Medications:   Xanax PRN.       Pediatrician:     [facility].   Pediatrician List:   [facility]      Pediatrician Fax Number:    Risk Factors/Current Problems:  Substance Use , Mental Health Concerns    Cognitive State:  Able to Concentrate , Alert , Insightful    Mood/Affect:  Interested , Comfortable , Relaxed , Calm    CSW Assessment: CSW consulted as MOB had THC use while pregnant. CSW went to speak with MOB at bedside.  Upon entering the room CSW congratulated MOB and FOB on the birth of infant, Joelene Millin. CSW advised MOB of the HIPPA policy and MOB reported that FOB could remain in the room. CSW understanding and advised MOB and FOB of the reason for CSW coming to visit with her and CSW's role. MOB reported that she was diagnosed with anxiety and depression officially  in April 2020. MOB reported that she has been going to therapy since she found out that she was pregnant as "I wanted to get over some of my trauma's and issues and to make sure that I am ready to be a mom". CSW commended MOB on this and voiced to her that this is a great idea to prepare MOB for the birth of infant. MOB reports that since being in therapy she has been learning coping skills, utilizations of treatment plans and ways to identify when things are bothering her. MOB expressed that she was on PRN Xanax while pregnant however would only take this as needed MOB reports that she kept in touch with her therapist during her pregnancy to discuss PPD and the  signs and symptoms of it. MOB reports that since she has given birth she has been feelign fine mentally with no concerns.   CSW spoke with MOB about her substance use while pregant. MOB reported that she used CBD in the past for her anxiety. MOB reported that she was aware that some CBD contained THC and the one she was using was one of them. MOB reported that she didn't use often. CSW understanding and advised MOB of the hospital drug screen policy. MOB was informed that infants UDS was positive for [facility] and that a CPS report would be made. MOB was very understanding and asked no further questions regarding CPS. MOB reports that she doesn't have any CPS history but her mom and dad may have things from her int eh past. MOB reports that she has support at this time from FOB and FOB's family. MOB reports that she has all needed items to care for infant with no further needs.   CSW inquired from [facility] on her  score of 13 on [place] with MOB reporting that she has a therapist and if needed would get back on medications for any anxiety or depression. CSW provided MOB with PPD Checklist as well as SIDS education. CSW encouraged MOB to look over PPD Checklist weekly to assess self. MOB expressed that she would.   CSW made [facility] CPS report. CSW will continue  to monitor infants CDS for other needs.   CSW Plan/Description:  No Further Intervention Required/No Barriers to Discharge, Sudden Infant Death Syndrome (SIDS) Education, Perinatal Mood and Anxiety Disorder (PMADs) Education    Wetzel Bjornstad, [place] 06/01/2019, 10:27 AM

## 2019-06-01 NOTE — Lactation Note (Signed)
This note was copied from a baby's chart. Lactation Consultation Note  Patient Name: Nicole Dyer [id number] Date: 06/01/2019 Reason for consult: Follow-up assessment;Primapara;1st time breastfeeding;Term  2241 - 2255 - I followed up with Nicole Dyer. She was breast feeding her son upon entry. Baby came off the breast and began to cry. She was holding baby in cross cradle hold and unable to re-latch baby. She sat him up and consoled him and burped him.   I offered to assist and we placed him in football hold. I helped her by showing her how to sandwich the nipple to establish latch. After a few attempts, he latched with audible swallows and rhythmic suckling sequences.  Nicole Dyer reports that baby has been organized today with feedings. She denies breast pain with latch.  I reviewed feeding plan to feed 8-12 times a day with cues and wake to feed as needed. I also educated on cluster feeding the second night, and I encouraged her to call for support as needed. I followed up with RN assigned to room this evening.  Maternal Data Has patient been taught Hand Expression?: Yes Does the patient have breastfeeding experience prior to this delivery?: No  Feeding Feeding Type: Breast Fed  LATCH Score Latch: Grasps breast easily, tongue down, lips flanged, rhythmical sucking.  Audible Swallowing: Spontaneous and intermittent  Type of Nipple: Everted at rest and after stimulation  Comfort (Breast/Nipple): Soft / non-tender  Hold (Positioning): Assistance needed to correctly position infant at breast and maintain latch.  LATCH Score: 9  Interventions Interventions: Breast feeding basics reviewed;Assisted with latch;Skin to skin;Hand express;Breast compression;Adjust position;Support pillows;Position options  Lactation Tools Discussed/Used     Consult Status Consult Status: Follow-up Date: 06/02/19 Follow-up type: In-patient    Lenore Manner 06/01/2019, 11:16 PM

## 2019-06-01 NOTE — Lactation Note (Addendum)
This note was copied from a baby's chart. Lactation Consultation Note  Patient Name: Nicole Dyer [id number] Date: 06/01/2019 Reason for consult: Follow-up assessment   LC Follow Up Visit:  Attempted to visit with mother, however, she was asleep.  Father was holding baby.  Asked him to have mother call me if she would like a lactation visit.  Father will inform mother I stopped by and to call as needed.    Consult Status Consult Status: Follow-up Date: 06/02/19 Follow-up type: In-patient    Kamden Stanislaw R Dayanis Bergquist 06/01/2019, 4:12 PM

## 2019-06-01 NOTE — Progress Notes (Signed)
PPD #1 No problems Afeb, VSS Fundus firm, NT at U-1 Continue routine postpartum care 

## 2019-06-02 LAB — SURGICAL PATHOLOGY

## 2019-06-02 MED ORDER — IBUPROFEN 800 MG PO TABS: 800.0000 mg | ORAL_TABLET | Freq: Three times a day (TID) | ORAL | 1 refills | Status: DC

## 2019-06-02 NOTE — Progress Notes (Signed)
CSW received call from [facility] with [facility] CPS. CSW was advised that Nicole Dyer would be following up with MOB and infant once arrived home.   At this time there are no barriers from CSW standpoint.      Virgie Dad Jasminne Mealy, MSW, LCSW [facility] [place] [facility] [place] [phone]

## 2019-06-02 NOTE — Lactation Note (Signed)
This note was copied from a baby's chart. Lactation Consultation Note  Patient Name: Nicole Dyer [id number] Date: 06/02/2019 Reason for consult: Follow-up assessment;Term;Primapara;1st time breastfeeding  P1 mother whose infant is now 92 hours old.  Baby was STS on mother's chest when I arrived.  She had a few questions about breast feeding and asked, "How do I know if he is getting enough?"  Reviewed feeding cues, milk coming to volume, voids/stools, infant behavior and feeding.  Discussed using EBM and coconut oil for comfort and also provided comfort gels to take home.    Engorgement prevention/treatment reviewed.  Manual pump with instructions for use given.  Mother does not currently have a DEBP for home use.  However, she is a [facility] participant in [facility] and has been in contact with the office about obtaining a pump after discharge.  Suggested she call this a.m. prior to discharge and informed her about our [facility] loaner program if needed.  Parents may be interested in this if they cannot obtain a pump today from their home office.  Mother has our OP phone number for questions/concerns after discharge.  Father present and supportive.   Maternal Data Formula Feeding for Exclusion: No Has patient been taught Hand Expression?: Yes Does the patient have breastfeeding experience prior to this delivery?: No  Feeding Feeding Type: Breast Fed  LATCH Score                   Interventions    Lactation Tools Discussed/Used WIC Program: Yes Pump Review: Setup, frequency, and cleaning Initiated by:: Caillou Minus Date initiated:: 06/02/19   Consult Status Consult Status: Complete Date: 06/02/19 Follow-up type: Call as needed    Ayanah Snader R Monti Villers 06/02/2019, 8:08 AM

## 2019-06-02 NOTE — Discharge Summary (Signed)
OB Discharge Summary     Patient Name: Nicole Dyer DOB: 1991/01/19 MRN: [id number]  Date of admission: 05/30/2019 Delivering MD: Carlynn Purl [facility]   Date of discharge: 06/02/2019  Admitting diagnosis: 39 wks water broke pressure and ctx Intrauterine pregnancy: [redacted]w[redacted]d     Secondary diagnosis:  Active Problems:   Normal labor   SVD (spontaneous vaginal delivery)  Additional problems: none     Discharge diagnosis: Term Pregnancy Delivered                                                                                                Post partum procedures:none  Augmentation: none  Complications: None  Hospital course:  Onset of Labor With Vaginal Delivery     28 y.o. yo G1P1001 at [redacted]w[redacted]d was admitted in Active Labor on 05/30/2019. Patient had an uncomplicated labor course as follows:  Membrane Rupture Time/Date: 7:00 PM ,05/30/2019   Intrapartum Procedures: Episiotomy: None [1]                                         Lacerations:  Periurethral [8]  Patient had a delivery of a Viable infant. 05/31/2019  Information for the patient's newborn:  Gaylynn, Seiple [[id number]]  Delivery Method: Vag-Spont     Pateint had an uncomplicated postpartum course.  She is ambulating, tolerating a regular diet, passing flatus, and urinating well. Patient is discharged home in stable condition on 06/02/19. Planning for outpatietn counseling for h/o anxiety 1wk after discharge. H/o hypothyroidism with nodules, repeat ultrasound due this December, already scheduled. Office circ for son   Physical exam  Vitals:   06/01/19 0104 06/01/19 0511 06/01/19 2238 06/02/19 0520  BP: (!) 106/56 108/68 112/71 114/71  Pulse: 72 63 82 73  Resp: 18 16 16 16   Temp: 98.3 F (36.8 C) 98 F (36.7 C) 98.4 F (36.9 C) 98.6 F (37 C)  TempSrc: Oral Oral Oral Oral  SpO2:      Weight:      Height:       General: alert, cooperative and no distress Lochia: appropriate Uterine Fundus: firm DVT  Evaluation: No evidence of DVT seen on physical exam. Negative Homan's sign. No cords or calf tenderness. Labs: Lab Results  Component Value Date   WBC 10.7 (H) 06/01/2019   HGB 10.5 (L) 06/01/2019   HCT 31.9 (L) 06/01/2019   MCV 90.4 06/01/2019   PLT 191 06/01/2019   No flowsheet data found.  Discharge instruction: per After Visit Summary and "Baby and Me Booklet".  After visit meds:  Allergies as of 06/02/2019      Reactions   Bee Venom Swelling   Poison Ivy Extract Swelling      Medication List    STOP taking these medications   acetaminophen 325 MG tablet Commonly known as: TYLENOL   aspirin 81 MG chewable tablet   promethazine 25 MG tablet Commonly known as: PHENERGAN     TAKE these medications   calcium carbonate 1500 (600 Ca)  MG Tabs tablet Commonly known as: OSCAL Take 300 mg by mouth 2 (two) times daily with a meal.   ibuprofen 800 MG tablet Commonly known as: ADVIL Take 1 tablet (800 mg total) by mouth every 8 (eight) hours.   prenatal multivitamin Tabs tablet Take 1 tablet by mouth daily at 12 noon.       Diet: routine diet  Activity: Advance as tolerated. Pelvic rest for 6 weeks.   Outpatient follow up:6 weeks Follow up Appt:No future appointments. Follow up Visit:No follow-ups on file.  Postpartum contraception: Undecided  Newborn Data: Live born female  Birth Weight: 7 lb 5.1 oz (3320 g) APGAR: 9, 9  Newborn Delivery   Birth date/time: 05/31/2019 09:59:00 Delivery type: Vaginal, Spontaneous      Baby Feeding: Breast Disposition:home with mother   06/02/2019 Carlisle Cater, MD

## 2019-06-02 NOTE — Progress Notes (Signed)
POSTPARTUM PROGRESS NOTE  Post Partum Day #2  Subjective:  No acute events overnight.  Pt denies problems with ambulating, voiding or po intake.  She denies nausea or vomiting.  Pain is well controlled.  She has had flatus. She has had bowel movement.  Lochia Minimal.   Objective: Blood pressure 114/71, pulse 73, temperature 98.6 F (37 C), temperature source Oral, resp. rate 16, height 5\' 10"  (1.778 m), weight 106 kg, SpO2 100 %, unknown if currently breastfeeding.  Physical Exam:  General: alert, cooperative and no distress Lochia:normal flow Chest: CTAB Heart: RRR no m/r/g Abdomen: +BS, soft, nontender Uterine Fundus: firm, 3cm below umbilicus Extremities: neg edema, neg calf TTP BL, neg Homans BL  Recent Labs    05/30/19 2351 06/01/19 0530  HGB 11.3* 10.5*  HCT 33.9* 31.9*    Assessment/Plan:  ASSESSMENT: Nicole Dyer is a 28 y.o. G1P1001 s/p SVD @ [redacted]w[redacted]d. PNC c/b hypothyroidism w/ nodules on [place], anxiety on counseling (no Rx), CF carrier.   Discharge home, Breastfeeding and Contraception unsure  -Office circumcision  -Planning on counseling visit next week, denies SI/HI today -Repeat thyroid ultrasound December 2020 already scheduled per patient  D/C home at this time   LOS: 2 days

## 2019-06-02 NOTE — Plan of Care (Signed)
Progressing appropriately. Encouraged to call for assistance as needed, and for LATCH assessment.  

## 2019-06-03 ENCOUNTER — Inpatient Hospital Stay (HOSPITAL_COMMUNITY): Payer: Medicaid Other

## 2019-06-03 ENCOUNTER — Inpatient Hospital Stay (HOSPITAL_COMMUNITY)
Admission: AD | Admit: 2019-06-03 | Payer: Medicaid Other | Source: Home / Self Care | Admitting: Obstetrics and Gynecology

## 2019-06-08 DIAGNOSIS — F410 Panic disorder [episodic paroxysmal anxiety] without agoraphobia: Secondary | ICD-10-CM | POA: Diagnosis not present

## 2019-06-15 DIAGNOSIS — F410 Panic disorder [episodic paroxysmal anxiety] without agoraphobia: Secondary | ICD-10-CM | POA: Diagnosis not present

## 2019-06-25 DIAGNOSIS — F410 Panic disorder [episodic paroxysmal anxiety] without agoraphobia: Secondary | ICD-10-CM | POA: Diagnosis not present

## 2019-07-01 DIAGNOSIS — F410 Panic disorder [episodic paroxysmal anxiety] without agoraphobia: Secondary | ICD-10-CM | POA: Diagnosis not present

## 2019-07-07 DIAGNOSIS — K644 Residual hemorrhoidal skin tags: Secondary | ICD-10-CM | POA: Diagnosis not present

## 2019-07-07 DIAGNOSIS — Z1389 Encounter for screening for other disorder: Secondary | ICD-10-CM | POA: Diagnosis not present

## 2019-07-07 DIAGNOSIS — Z3009 Encounter for other general counseling and advice on contraception: Secondary | ICD-10-CM | POA: Diagnosis not present

## 2019-07-08 DIAGNOSIS — F410 Panic disorder [episodic paroxysmal anxiety] without agoraphobia: Secondary | ICD-10-CM | POA: Diagnosis not present

## 2019-07-13 DIAGNOSIS — S82852D Displaced trimalleolar fracture of left lower leg, subsequent encounter for closed fracture with routine healing: Secondary | ICD-10-CM | POA: Diagnosis not present

## 2019-07-13 DIAGNOSIS — T8484XD Pain due to internal orthopedic prosthetic devices, implants and grafts, subsequent encounter: Secondary | ICD-10-CM | POA: Diagnosis not present

## 2019-07-16 DIAGNOSIS — Z3200 Encounter for pregnancy test, result unknown: Secondary | ICD-10-CM | POA: Diagnosis not present

## 2019-07-16 DIAGNOSIS — Z0289 Encounter for other administrative examinations: Secondary | ICD-10-CM | POA: Diagnosis not present

## 2019-07-16 DIAGNOSIS — F410 Panic disorder [episodic paroxysmal anxiety] without agoraphobia: Secondary | ICD-10-CM | POA: Diagnosis not present

## 2019-07-16 DIAGNOSIS — Z1389 Encounter for screening for other disorder: Secondary | ICD-10-CM | POA: Diagnosis not present

## 2019-07-16 DIAGNOSIS — F4322 Adjustment disorder with anxiety: Secondary | ICD-10-CM | POA: Diagnosis not present

## 2019-07-20 DIAGNOSIS — Z01818 Encounter for other preprocedural examination: Secondary | ICD-10-CM | POA: Diagnosis not present

## 2019-07-21 DIAGNOSIS — F410 Panic disorder [episodic paroxysmal anxiety] without agoraphobia: Secondary | ICD-10-CM | POA: Diagnosis not present

## 2019-07-22 DIAGNOSIS — Z87891 Personal history of nicotine dependence: Secondary | ICD-10-CM | POA: Diagnosis not present

## 2019-07-22 DIAGNOSIS — E669 Obesity, unspecified: Secondary | ICD-10-CM | POA: Diagnosis not present

## 2019-07-22 DIAGNOSIS — T8484XA Pain due to internal orthopedic prosthetic devices, implants and grafts, initial encounter: Secondary | ICD-10-CM | POA: Diagnosis not present

## 2019-07-22 DIAGNOSIS — Z6831 Body mass index (BMI) 31.0-31.9, adult: Secondary | ICD-10-CM | POA: Diagnosis not present

## 2019-07-22 DIAGNOSIS — T8484XD Pain due to internal orthopedic prosthetic devices, implants and grafts, subsequent encounter: Secondary | ICD-10-CM | POA: Diagnosis not present

## 2019-07-22 DIAGNOSIS — S82852D Displaced trimalleolar fracture of left lower leg, subsequent encounter for closed fracture with routine healing: Secondary | ICD-10-CM | POA: Diagnosis not present

## 2019-07-28 DIAGNOSIS — F410 Panic disorder [episodic paroxysmal anxiety] without agoraphobia: Secondary | ICD-10-CM | POA: Diagnosis not present

## 2019-08-04 DIAGNOSIS — K649 Unspecified hemorrhoids: Secondary | ICD-10-CM | POA: Diagnosis not present

## 2019-08-06 DIAGNOSIS — F410 Panic disorder [episodic paroxysmal anxiety] without agoraphobia: Secondary | ICD-10-CM | POA: Diagnosis not present

## 2019-08-11 DIAGNOSIS — F410 Panic disorder [episodic paroxysmal anxiety] without agoraphobia: Secondary | ICD-10-CM | POA: Diagnosis not present

## 2019-08-18 DIAGNOSIS — F410 Panic disorder [episodic paroxysmal anxiety] without agoraphobia: Secondary | ICD-10-CM | POA: Diagnosis not present

## 2019-08-25 DIAGNOSIS — F410 Panic disorder [episodic paroxysmal anxiety] without agoraphobia: Secondary | ICD-10-CM | POA: Diagnosis not present

## 2019-09-01 DIAGNOSIS — F410 Panic disorder [episodic paroxysmal anxiety] without agoraphobia: Secondary | ICD-10-CM | POA: Diagnosis not present

## 2019-09-08 DIAGNOSIS — F410 Panic disorder [episodic paroxysmal anxiety] without agoraphobia: Secondary | ICD-10-CM | POA: Diagnosis not present

## 2019-09-15 DIAGNOSIS — F410 Panic disorder [episodic paroxysmal anxiety] without agoraphobia: Secondary | ICD-10-CM | POA: Diagnosis not present

## 2019-09-15 DIAGNOSIS — M5432 Sciatica, left side: Secondary | ICD-10-CM | POA: Diagnosis not present

## 2019-09-15 DIAGNOSIS — Z1389 Encounter for screening for other disorder: Secondary | ICD-10-CM | POA: Diagnosis not present

## 2019-09-15 DIAGNOSIS — M545 Low back pain: Secondary | ICD-10-CM | POA: Diagnosis not present

## 2019-09-15 DIAGNOSIS — E669 Obesity, unspecified: Secondary | ICD-10-CM | POA: Diagnosis not present

## 2019-09-22 DIAGNOSIS — F410 Panic disorder [episodic paroxysmal anxiety] without agoraphobia: Secondary | ICD-10-CM | POA: Diagnosis not present

## 2019-09-29 DIAGNOSIS — F410 Panic disorder [episodic paroxysmal anxiety] without agoraphobia: Secondary | ICD-10-CM | POA: Diagnosis not present

## 2019-10-05 DIAGNOSIS — M545 Low back pain: Secondary | ICD-10-CM | POA: Diagnosis not present

## 2019-10-06 DIAGNOSIS — F410 Panic disorder [episodic paroxysmal anxiety] without agoraphobia: Secondary | ICD-10-CM | POA: Diagnosis not present

## 2019-10-15 DIAGNOSIS — F410 Panic disorder [episodic paroxysmal anxiety] without agoraphobia: Secondary | ICD-10-CM | POA: Diagnosis not present

## 2019-10-20 DIAGNOSIS — M545 Low back pain: Secondary | ICD-10-CM | POA: Diagnosis not present

## 2019-10-20 DIAGNOSIS — F410 Panic disorder [episodic paroxysmal anxiety] without agoraphobia: Secondary | ICD-10-CM | POA: Diagnosis not present

## 2019-10-22 DIAGNOSIS — M545 Low back pain: Secondary | ICD-10-CM | POA: Diagnosis not present

## 2019-10-27 DIAGNOSIS — F410 Panic disorder [episodic paroxysmal anxiety] without agoraphobia: Secondary | ICD-10-CM | POA: Diagnosis not present

## 2019-11-03 DIAGNOSIS — F410 Panic disorder [episodic paroxysmal anxiety] without agoraphobia: Secondary | ICD-10-CM | POA: Diagnosis not present

## 2019-11-09 DIAGNOSIS — M545 Low back pain: Secondary | ICD-10-CM | POA: Diagnosis not present

## 2019-11-13 ENCOUNTER — Other Ambulatory Visit: Payer: Self-pay

## 2019-11-13 ENCOUNTER — Encounter: Payer: Self-pay | Admitting: Physician Assistant

## 2019-11-13 ENCOUNTER — Ambulatory Visit: Payer: Medicaid Other | Admitting: Physician Assistant

## 2019-11-13 VITALS — BP 98/65 | HR 64 | Temp 97.7°F | Resp 16 | Ht 70.0 in | Wt 221.2 lb

## 2019-11-13 DIAGNOSIS — E039 Hypothyroidism, unspecified: Secondary | ICD-10-CM | POA: Insufficient documentation

## 2019-11-13 DIAGNOSIS — O99345 Other mental disorders complicating the puerperium: Secondary | ICD-10-CM | POA: Diagnosis not present

## 2019-11-13 DIAGNOSIS — E034 Atrophy of thyroid (acquired): Secondary | ICD-10-CM | POA: Diagnosis not present

## 2019-11-13 DIAGNOSIS — F53 Postpartum depression: Secondary | ICD-10-CM

## 2019-11-13 DIAGNOSIS — E282 Polycystic ovarian syndrome: Secondary | ICD-10-CM | POA: Insufficient documentation

## 2019-11-13 DIAGNOSIS — F32A Depression, unspecified: Secondary | ICD-10-CM | POA: Insufficient documentation

## 2019-11-13 DIAGNOSIS — F419 Anxiety disorder, unspecified: Secondary | ICD-10-CM | POA: Diagnosis not present

## 2019-11-13 DIAGNOSIS — E6609 Other obesity due to excess calories: Secondary | ICD-10-CM

## 2019-11-13 DIAGNOSIS — F329 Major depressive disorder, single episode, unspecified: Secondary | ICD-10-CM | POA: Insufficient documentation

## 2019-11-13 DIAGNOSIS — F410 Panic disorder [episodic paroxysmal anxiety] without agoraphobia: Secondary | ICD-10-CM | POA: Diagnosis not present

## 2019-11-13 DIAGNOSIS — Z6831 Body mass index (BMI) 31.0-31.9, adult: Secondary | ICD-10-CM | POA: Diagnosis not present

## 2019-11-13 MED ORDER — ESCITALOPRAM OXALATE 10 MG PO TABS: 10.0000 mg | ORAL_TABLET | Freq: Every day | ORAL | 1 refills | Status: DC

## 2019-11-13 NOTE — Patient Instructions (Signed)
Escitalopram tablets What is this medicine? ESCITALOPRAM (es sye TAL oh pram) is used to treat depression and certain types of anxiety. This medicine may be used for other purposes; ask your health care provider or pharmacist if you have questions. COMMON BRAND NAME(S): Lexapro What should I tell my health care provider before I take this medicine? They need to know if you have any of these conditions:  bipolar disorder or a family history of bipolar disorder  diabetes  glaucoma  heart disease  kidney or liver disease  receiving electroconvulsive therapy  seizures (convulsions)  suicidal thoughts, plans, or attempt by you or a family member  an unusual or allergic reaction to escitalopram, the related drug citalopram, other medicines, foods, dyes, or preservatives  pregnant or trying to become pregnant  breast-feeding How should I use this medicine? Take this medicine by mouth with a glass of water. Follow the directions on the prescription label. You can take it with or without food. If it upsets your stomach, take it with food. Take your medicine at regular intervals. Do not take it more often than directed. Do not stop taking this medicine suddenly except upon the advice of your doctor. Stopping this medicine too quickly may cause serious side effects or your condition may worsen. A special MedGuide will be given to you by the pharmacist with each prescription and refill. Be sure to read this information carefully each time. Talk to your pediatrician regarding the use of this medicine in children. Special care may be needed. Overdosage: If you think you have taken too much of this medicine contact a poison control center or emergency room at once. NOTE: This medicine is only for you. Do not share this medicine with others. What if I miss a dose? If you miss a dose, take it as soon as you can. If it is almost time for your next dose, take only that dose. Do not take double or  extra doses. What may interact with this medicine? Do not take this medicine with any of the following medications:  certain medicines for fungal infections like fluconazole, itraconazole, ketoconazole, posaconazole, voriconazole  cisapride  citalopram  dronedarone  linezolid  MAOIs like Carbex, Eldepryl, Marplan, Nardil, and Parnate  methylene blue (injected into a vein)  pimozide  thioridazine This medicine may also interact with the following medications:  alcohol  amphetamines  aspirin and aspirin-like medicines  carbamazepine  certain medicines for depression, anxiety, or psychotic disturbances  certain medicines for migraine headache like almotriptan, eletriptan, frovatriptan, naratriptan, rizatriptan, sumatriptan, zolmitriptan  certain medicines for sleep  certain medicines that treat or prevent blood clots like warfarin, enoxaparin, dalteparin  cimetidine  diuretics  dofetilide  fentanyl  furazolidone  isoniazid  lithium  metoprolol  NSAIDs, medicines for pain and inflammation, like ibuprofen or naproxen  other medicines that prolong the QT interval (cause an abnormal heart rhythm)  procarbazine  rasagiline  supplements like St. John's wort, kava kava, valerian  tramadol  tryptophan  ziprasidone This list may not describe all possible interactions. Give your health care provider a list of all the medicines, herbs, non-prescription drugs, or dietary supplements you use. Also tell them if you smoke, drink alcohol, or use illegal drugs. Some items may interact with your medicine. What should I watch for while using this medicine? Tell your doctor if your symptoms do not get better or if they get worse. Visit your doctor or health care professional for regular checks on your progress. Because it may   take several weeks to see the full effects of this medicine, it is important to continue your treatment as prescribed by your doctor. Patients  and their families should watch out for new or worsening thoughts of suicide or depression. Also watch out for sudden changes in feelings such as feeling anxious, agitated, panicky, irritable, hostile, aggressive, impulsive, severely restless, overly excited and hyperactive, or not being able to sleep. If this happens, especially at the beginning of treatment or after a change in dose, call your health care professional. You may get drowsy or dizzy. Do not drive, use machinery, or do anything that needs mental alertness until you know how this medicine affects you. Do not stand or sit up quickly, especially if you are an older patient. This reduces the risk of dizzy or fainting spells. Alcohol may interfere with the effect of this medicine. Avoid alcoholic drinks. Your mouth may get dry. Chewing sugarless gum or sucking hard candy, and drinking plenty of water may help. Contact your doctor if the problem does not go away or is severe. What side effects may I notice from receiving this medicine? Side effects that you should report to your doctor or health care professional as soon as possible:  allergic reactions like skin rash, itching or hives, swelling of the face, lips, or tongue  anxious  black, tarry stools  changes in vision  confusion  elevated mood, decreased need for sleep, racing thoughts, impulsive behavior  eye pain  fast, irregular heartbeat  feeling faint or lightheaded, falls  feeling agitated, angry, or irritable  hallucination, loss of contact with reality  loss of balance or coordination  loss of memory  painful or prolonged erections  restlessness, pacing, inability to keep still  seizures  stiff muscles  suicidal thoughts or other mood changes  trouble sleeping  unusual bleeding or bruising  unusually weak or tired  vomiting Side effects that usually do not require medical attention (report to your doctor or health care professional if they  continue or are bothersome):  changes in appetite  change in sex drive or performance  headache  increased sweating  indigestion, nausea  tremors This list may not describe all possible side effects. Call your doctor for medical advice about side effects. You may report side effects to FDA at 1-800-FDA-1088. Where should I keep my medicine? Keep out of reach of children. Store at room temperature between 15 and 30 degrees C (59 and 86 degrees F). Throw away any unused medicine after the expiration date. NOTE: This sheet is a summary. It may not cover all possible information. If you have questions about this medicine, talk to your doctor, pharmacist, or health care provider.  2020 Elsevier/Gold Standard (2018-07-14 11:21:44)  

## 2019-11-13 NOTE — Progress Notes (Signed)
New patient visit      Patient: Nicole Dyer   DOB: Nov 16, 1990   29 y.o. Female  MRN: [id number] Visit Date: 11/13/2019  Today's healthcare provider: Mar Daring, PA-C  Subjective:    Chief Complaint  Patient presents with  . New Patient (Initial Visit)    Nicole Dyer is a 29 y.o. female who presents today as a new patient to establish care.  HPI  Nicole Dyer is a 29 yr old female establishing care today. She was referred by her counselor for further treatment of her anxiety and depression. She has no previous PCP and was only seeing her OB/GYN previously. She recently had a son in October 2020. She has been dealing with her depression and anxiety for a long period of time. Reports it all worsened once she became pregnant. She reports she does have family close by, but it is more a strained relationship and is not very close with them. She is currently living with her boyfriend and his family. She does not have a lot of support from his family. They tell her "just to get over it." They do not understand why she has emotional outbursts and wants to isolate and get away. She is in counseling and has been working on coping mechanisms but is still struggling on the day to day. She does report feeling bonded to her son and feels they have a good relationship.  Past Medical History:  Diagnosis Date  . Anxiety   . Depression   . Hypothyroidism   . PCOS (polycystic ovarian syndrome)    Past Surgical History:  Procedure Laterality Date  . ANKLE SURGERY Left    Family Status  Relation Name Status  . Mother  Alive       thyroid cancer  . Father  Alive  . Son  Alive   Family History  Problem Relation Age of Onset  . Cancer Mother        thyroid  . Cancer Father   . Hypertension Father    Social History   Socioeconomic History  . Marital status: Single    Spouse name: Not on file  . Number of children: 1  . Years of education: Not on file  . Highest  education level: Not on file  Occupational History  . Not on file  Tobacco Use  . Smoking status: Never Smoker  . Smokeless tobacco: Never Used  Substance and Sexual Activity  . Alcohol use: Never  . Drug use: Yes    Frequency: 3.0 times per week    Types: Marijuana  . Sexual activity: Yes  Other Topics Concern  . Not on file  Social History Narrative  . Not on file   Social Determinants of Health   Financial Resource Strain:   . Difficulty of Paying Living Expenses:   Food Insecurity:   . Worried About Charity fundraiser in the Last Year:   . Arboriculturist in the Last Year:   Transportation Needs:   . Film/video editor (Medical):   Marland Kitchen Lack of Transportation (Non-Medical):   Physical Activity:   . Days of Exercise per Week:   . Minutes of Exercise per Session:   Stress:   . Feeling of Stress :   Social Connections:   . Frequency of Communication with Friends and Family:   . Frequency of Social Gatherings with Friends and Family:   . Attends Religious Services:   . Active Member of  Clubs or Organizations:   . Attends Banker Meetings:   Marland Kitchen Marital Status:    Outpatient Medications Prior to Visit  Medication Sig  . acetaminophen (TYLENOL) 500 MG tablet Take by mouth.  Marland Kitchen aspirin 81 MG EC tablet Take by mouth.  . calcium carbonate (OSCAL) 1500 (600 Ca) MG TABS tablet Take 300 mg by mouth daily with breakfast.   . ibuprofen (ADVIL) 800 MG tablet Take 1 tablet (800 mg total) by mouth every 8 (eight) hours.  . Prenatal Vit-Fe Fumarate-FA (PRENATAL MULTIVITAMIN) TABS tablet Take 1 tablet by mouth daily at 12 noon.   No facility-administered medications prior to visit.   Allergies  Allergen Reactions  . Bee Venom Swelling  . Poison Ivy Extract Swelling    Patient Care Team: Margaretann Loveless, PA-C as PCP - General (Family Medicine)  Review of Systems  Constitutional: Positive for activity change, appetite change, chills, diaphoresis, fatigue  and unexpected weight change.  HENT: Positive for dental problem, facial swelling, hearing loss, tinnitus, trouble swallowing and voice change.   Eyes: Positive for pain, itching and visual disturbance.  Respiratory: Positive for chest tightness.   Cardiovascular: Negative.   Gastrointestinal: Positive for constipation, diarrhea, nausea and vomiting.  Endocrine: Positive for cold intolerance and heat intolerance.  Genitourinary: Negative.   Musculoskeletal: Positive for joint swelling, myalgias and neck stiffness.  Skin: Negative.   Allergic/Immunologic: Negative.   Neurological: Positive for headaches.  Hematological: Positive for adenopathy. Bruises/bleeds easily.  Psychiatric/Behavioral: Positive for agitation, behavioral problems, decreased concentration, dysphoric mood and sleep disturbance. The patient is nervous/anxious.        Objective:    BP 98/65 (BP Location: Left Arm, Patient Position: Sitting, Cuff Size: Large)   Pulse 64   Temp 97.7 F (36.5 C) (Temporal)   Resp 16   Ht 5\' 10"  (1.778 m)   Wt 221 lb 3.2 oz (100.3 kg)   Breastfeeding Yes   BMI 31.74 kg/m  Physical Exam Vitals reviewed.  Constitutional:      General: She is not in acute distress.    Appearance: Normal appearance. She is well-developed. She is obese. She is not ill-appearing or diaphoretic.  HENT:     Head: Normocephalic and atraumatic.     Right Ear: Tympanic membrane, ear canal and external ear normal.     Left Ear: Tympanic membrane, ear canal and external ear normal.  Eyes:     General: No scleral icterus.       Right eye: No discharge.        Left eye: No discharge.     Extraocular Movements: Extraocular movements intact.     Conjunctiva/sclera: Conjunctivae normal.     Pupils: Pupils are equal, round, and reactive to light.  Neck:     Thyroid: No thyromegaly.     Vascular: No JVD.     Trachea: No tracheal deviation.  Cardiovascular:     Rate and Rhythm: Normal rate and regular  rhythm.     Pulses: Normal pulses.     Heart sounds: Normal heart sounds. No murmur. No friction rub. No gallop.   Pulmonary:     Effort: Pulmonary effort is normal. No respiratory distress.     Breath sounds: Normal breath sounds. No wheezing or rales.  Chest:     Chest wall: No tenderness.  Abdominal:     General: Abdomen is flat. Bowel sounds are normal. There is no distension.     Palpations: Abdomen is soft. There is no  mass.     Tenderness: There is no abdominal tenderness. There is no guarding or rebound.  Musculoskeletal:        General: No tenderness. Normal range of motion.     Cervical back: Normal range of motion and neck supple.     Right lower leg: No edema.     Left lower leg: No edema.  Lymphadenopathy:     Cervical: No cervical adenopathy.  Skin:    General: Skin is warm and dry.     Capillary Refill: Capillary refill takes less than 2 seconds.     Findings: No rash.  Neurological:     General: No focal deficit present.     Mental Status: She is alert and oriented to person, place, and time. Mental status is at baseline.  Psychiatric:        Attention and Perception: Attention and perception normal.        Mood and Affect: Mood is anxious and depressed. Affect is tearful.        Speech: Speech normal.        Behavior: Behavior normal. Behavior is cooperative.        Thought Content: Thought content normal.        Cognition and Memory: Cognition and memory normal.        Judgment: Judgment normal.      No results found for any visits on 11/13/19.    Assessment & Plan:    1. Anxiety Patient has been struggling for a long time with anxiety and depression. She has never tried any medications in the past. Will start with Lexapro as below. Will check labs as below and f/u pending results. I will see her back in 4-6 weeks to see how she is tolerating the medication and see how symptoms improve.  - CBC w/Diff/Platelet - Comprehensive Metabolic Panel (CMET) -  Vitamin D (25 hydroxy) - escitalopram (LEXAPRO) 10 MG tablet; Take 1 tablet (10 mg total) by mouth at bedtime. Start with 0.5 tab PO q hs x 1 week then increase to 1 tab  Dispense: 90 tablet; Refill: 1  2. Postpartum depression See above medical treatment plan. - CBC w/Diff/Platelet - Comprehensive Metabolic Panel (CMET) - Vitamin D (25 hydroxy) - escitalopram (LEXAPRO) 10 MG tablet; Take 1 tablet (10 mg total) by mouth at bedtime. Start with 0.5 tab PO q hs x 1 week then increase to 1 tab  Dispense: 90 tablet; Refill: 1  3. Panic disorder See above medical treatment plan. - CBC w/Diff/Platelet - Comprehensive Metabolic Panel (CMET) - escitalopram (LEXAPRO) 10 MG tablet; Take 1 tablet (10 mg total) by mouth at bedtime. Start with 0.5 tab PO q hs x 1 week then increase to 1 tab  Dispense: 90 tablet; Refill: 1  4. Class 1 obesity due to excess calories with serious comorbidity and body mass index (BMI) of 31.0 to 31.9 in adult Counseled patient on healthy lifestyle modifications including dieting and exercise.  - CBC w/Diff/Platelet - Comprehensive Metabolic Panel (CMET) - Vitamin D (25 hydroxy)  5. PCOS (polycystic ovarian syndrome) H/O this. Will check labs as below and f/u pending results. - CBC w/Diff/Platelet - Comprehensive Metabolic Panel (CMET) - Insulin, Free and Total - Cortisol  6. Hypothyroidism due to acquired atrophy of thyroid Will check labs as below and f/u pending results. - CBC w/Diff/Platelet - T4 AND TSH      Reine Just  [facility] [phone] (phone) [phone] (fax)  [facility]

## 2019-11-16 ENCOUNTER — Telehealth: Payer: Self-pay

## 2019-11-16 DIAGNOSIS — M545 Low back pain: Secondary | ICD-10-CM | POA: Diagnosis not present

## 2019-11-16 DIAGNOSIS — E0590 Thyrotoxicosis, unspecified without thyrotoxic crisis or storm: Secondary | ICD-10-CM

## 2019-11-16 NOTE — Telephone Encounter (Signed)
-----   Message from Margaretann Loveless, [place] sent at 11/16/2019  7:37 AM EDT ----- Blood count is normal. Kidney and liver function are normal. Sugar is normal. Sodium, potassium, and calcium are normal. Thyroid is showing to be slightly overactive (hyperthyroidism). Would recommend [place] to recheck in 6 weeks. Vit D is normal. Cortisol levels for adrenal insufficiency are normal. Insulin levels are still pending.

## 2019-11-16 NOTE — Telephone Encounter (Signed)
Cell/Home number not in service at this moment.

## 2019-11-17 DIAGNOSIS — F410 Panic disorder [episodic paroxysmal anxiety] without agoraphobia: Secondary | ICD-10-CM | POA: Diagnosis not present

## 2019-11-21 LAB — VITAMIN D 25 HYDROXY (VIT D DEFICIENCY, FRACTURES): Vit D, 25-Hydroxy: 31.9 ng/mL (ref 30.0–100.0)

## 2019-11-21 LAB — COMPREHENSIVE METABOLIC PANEL
ALT: 13 IU/L (ref 0–32)
AST: 15 IU/L (ref 0–40)
Albumin/Globulin Ratio: 1.8 (ref 1.2–2.2)
Albumin: 4.4 g/dL (ref 3.9–5.0)
Alkaline Phosphatase: 95 IU/L (ref 39–117)
BUN/Creatinine Ratio: 11 (ref 9–23)
BUN: 8 mg/dL (ref 6–20)
Bilirubin Total: 0.4 mg/dL (ref 0.0–1.2)
CO2: 23 mmol/L (ref 20–29)
Calcium: 9.3 mg/dL (ref 8.7–10.2)
Chloride: 104 mmol/L (ref 96–106)
Creatinine, Ser: 0.73 mg/dL (ref 0.57–1.00)
GFR calc Af Amer: 130 mL/min/{1.73_m2} (ref >59)
GFR calc non Af Amer: 112 mL/min/{1.73_m2} (ref >59)
Globulin, Total: 2.4 g/dL (ref 1.5–4.5)
Glucose: 80 mg/dL (ref 65–99)
Potassium: 4 mmol/L (ref 3.5–5.2)
Sodium: 141 mmol/L (ref 134–144)
Total Protein: 6.8 g/dL (ref 6.0–8.5)

## 2019-11-21 LAB — INSULIN, FREE AND TOTAL
Free Insulin: 5.6 uU/mL
Total Insulin: 5.6 uU/mL

## 2019-11-21 LAB — CBC WITH DIFFERENTIAL/PLATELET
Basophils Absolute: 0 10*3/uL (ref 0.0–0.2)
Basos: 0 %
EOS (ABSOLUTE): 0.1 10*3/uL (ref 0.0–0.4)
Eos: 2 %
Hematocrit: 36.6 % (ref 34.0–46.6)
Hemoglobin: 12.5 g/dL (ref 11.1–15.9)
Immature Grans (Abs): 0 10*3/uL (ref 0.0–0.1)
Immature Granulocytes: 0 %
Lymphocytes Absolute: 1.4 10*3/uL (ref 0.7–3.1)
Lymphs: 18 %
MCH: 28.7 pg (ref 26.6–33.0)
MCHC: 34.2 g/dL (ref 31.5–35.7)
MCV: 84 fL (ref 79–97)
Monocytes Absolute: 0.7 10*3/uL (ref 0.1–0.9)
Monocytes: 9 %
Neutrophils Absolute: 5.3 10*3/uL (ref 1.4–7.0)
Neutrophils: 71 %
Platelets: 258 10*3/uL (ref 150–450)
RBC: 4.36 x10E6/uL (ref 3.77–5.28)
RDW: 11.8 % (ref 11.7–15.4)
WBC: 7.6 10*3/uL (ref 3.4–10.8)

## 2019-11-21 LAB — T4 AND TSH
T4, Total: 6.6 ug/dL (ref 4.5–12.0)
TSH: 0.397 u[IU]/mL — ABNORMAL LOW (ref 0.450–4.500)

## 2019-11-21 LAB — CORTISOL: Cortisol: 4.2 ug/dL

## 2019-11-23 DIAGNOSIS — M545 Low back pain: Secondary | ICD-10-CM | POA: Diagnosis not present

## 2019-11-23 NOTE — Telephone Encounter (Signed)
Patient number is not in service. Called patient's mother number that is one of the emergency contacts Left message if she can let the patient know to call us back.  If patient calls back is OK for [facility] to give the lab results. thanks.

## 2019-11-23 NOTE — Telephone Encounter (Signed)
-----   Message from Margaretann Loveless, PA-C sent at 11/23/2019  8:28 AM EDT ----- Insulin levels are normal.

## 2019-11-24 DIAGNOSIS — F329 Major depressive disorder, single episode, unspecified: Secondary | ICD-10-CM | POA: Diagnosis not present

## 2019-11-24 DIAGNOSIS — F53 Postpartum depression: Secondary | ICD-10-CM | POA: Diagnosis not present

## 2019-11-24 NOTE — Telephone Encounter (Signed)
Did you want to check anything other than TSH?    Thanks,   -Vernona Rieger

## 2019-11-25 NOTE — Telephone Encounter (Signed)
Labs ordered.

## 2019-11-26 DIAGNOSIS — F410 Panic disorder [episodic paroxysmal anxiety] without agoraphobia: Secondary | ICD-10-CM | POA: Diagnosis not present

## 2019-11-30 DIAGNOSIS — M545 Low back pain: Secondary | ICD-10-CM | POA: Diagnosis not present

## 2019-12-03 DIAGNOSIS — F410 Panic disorder [episodic paroxysmal anxiety] without agoraphobia: Secondary | ICD-10-CM | POA: Diagnosis not present

## 2019-12-07 DIAGNOSIS — M545 Low back pain: Secondary | ICD-10-CM | POA: Diagnosis not present

## 2019-12-09 DIAGNOSIS — F410 Panic disorder [episodic paroxysmal anxiety] without agoraphobia: Secondary | ICD-10-CM | POA: Diagnosis not present

## 2019-12-14 DIAGNOSIS — M545 Low back pain: Secondary | ICD-10-CM | POA: Diagnosis not present

## 2019-12-17 DIAGNOSIS — F410 Panic disorder [episodic paroxysmal anxiety] without agoraphobia: Secondary | ICD-10-CM | POA: Diagnosis not present

## 2019-12-18 ENCOUNTER — Ambulatory Visit (INDEPENDENT_AMBULATORY_CARE_PROVIDER_SITE_OTHER): Payer: Medicaid Other | Admitting: Physician Assistant

## 2019-12-18 ENCOUNTER — Encounter: Payer: Self-pay | Admitting: Physician Assistant

## 2019-12-18 ENCOUNTER — Other Ambulatory Visit: Payer: Self-pay

## 2019-12-18 VITALS — BP 119/73 | HR 60 | Temp 97.4°F | Resp 16 | Wt 218.0 lb

## 2019-12-18 DIAGNOSIS — F419 Anxiety disorder, unspecified: Secondary | ICD-10-CM

## 2019-12-18 DIAGNOSIS — E0590 Thyrotoxicosis, unspecified without thyrotoxic crisis or storm: Secondary | ICD-10-CM

## 2019-12-18 DIAGNOSIS — S99912S Unspecified injury of left ankle, sequela: Secondary | ICD-10-CM

## 2019-12-18 DIAGNOSIS — F53 Postpartum depression: Secondary | ICD-10-CM

## 2019-12-18 DIAGNOSIS — O99345 Other mental disorders complicating the puerperium: Secondary | ICD-10-CM

## 2019-12-18 MED ORDER — ESCITALOPRAM OXALATE 10 MG PO TABS: 10.0000 mg | ORAL_TABLET | Freq: Every day | ORAL | 1 refills | Status: DC
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
## 2019-12-19 LAB — T4 AND TSH
T4, Total: 6.1 ug/dL (ref 4.5–12.0)
TSH: 0.245 u[IU]/mL — ABNORMAL LOW (ref 0.450–4.500)

## 2019-12-22 DIAGNOSIS — M25472 Effusion, left ankle: Secondary | ICD-10-CM | POA: Diagnosis not present

## 2019-12-22 DIAGNOSIS — M19072 Primary osteoarthritis, left ankle and foot: Secondary | ICD-10-CM | POA: Diagnosis not present

## 2019-12-22 DIAGNOSIS — T8484XA Pain due to internal orthopedic prosthetic devices, implants and grafts, initial encounter: Secondary | ICD-10-CM | POA: Diagnosis not present

## 2019-12-22 DIAGNOSIS — S82852D Displaced trimalleolar fracture of left lower leg, subsequent encounter for closed fracture with routine healing: Secondary | ICD-10-CM | POA: Diagnosis not present

## 2019-12-24 DIAGNOSIS — F410 Panic disorder [episodic paroxysmal anxiety] without agoraphobia: Secondary | ICD-10-CM | POA: Diagnosis not present

## 2019-12-29 ENCOUNTER — Telehealth: Payer: Self-pay

## 2019-12-29 DIAGNOSIS — E0590 Thyrotoxicosis, unspecified without thyrotoxic crisis or storm: Secondary | ICD-10-CM

## 2019-12-29 NOTE — Telephone Encounter (Signed)
Patient advised and agree to [place]

## 2019-12-29 NOTE — Telephone Encounter (Signed)
-----   Message from Margaretann Loveless, [place] sent at 12/29/2019 11:58 AM EDT ----- Thyroid does continue to be overactive. Would recommend a thyroid US.

## 2019-12-29 NOTE — Telephone Encounter (Signed)
Ordered. Radiology dept will call to schedule

## 2019-12-31 DIAGNOSIS — F410 Panic disorder [episodic paroxysmal anxiety] without agoraphobia: Secondary | ICD-10-CM | POA: Diagnosis not present

## 2020-01-01 ENCOUNTER — Ambulatory Visit
Admission: RE | Admit: 2020-01-01 | Discharge: 2020-01-01 | Disposition: A | Discharge status: Home / Self Care | Payer: Medicaid Other | Source: Ambulatory Visit | Attending: Physician Assistant | Admitting: Physician Assistant

## 2020-01-01 ENCOUNTER — Other Ambulatory Visit: Payer: Self-pay

## 2020-01-01 ENCOUNTER — Telehealth: Payer: Self-pay

## 2020-01-01 DIAGNOSIS — E0590 Thyrotoxicosis, unspecified without thyrotoxic crisis or storm: Secondary | ICD-10-CM | POA: Diagnosis not present

## 2020-01-01 NOTE — Telephone Encounter (Signed)
Pt advised.   Thanks,   -Jalaina Salyers  

## 2020-01-01 NOTE — Telephone Encounter (Signed)
-----   Message from Margaretann Loveless, [place] sent at 01/01/2020  3:43 PM EDT ----- There is one small nodule on the thyroid that does not meet criteria for follow up and there is a lymph node that is beside the thyroid that is slightly enlarged but benign appearing.   Would recommend to f/u Thyroid labs and check thyroid antibodies for autoimmune process in 4 weeks.

## 2020-01-05 DIAGNOSIS — M25472 Effusion, left ankle: Secondary | ICD-10-CM | POA: Diagnosis not present

## 2020-01-05 DIAGNOSIS — S82852D Displaced trimalleolar fracture of left lower leg, subsequent encounter for closed fracture with routine healing: Secondary | ICD-10-CM | POA: Diagnosis not present

## 2020-01-05 DIAGNOSIS — M19072 Primary osteoarthritis, left ankle and foot: Secondary | ICD-10-CM | POA: Diagnosis not present

## 2020-01-07 DIAGNOSIS — F410 Panic disorder [episodic paroxysmal anxiety] without agoraphobia: Secondary | ICD-10-CM | POA: Diagnosis not present

## 2020-01-11 DIAGNOSIS — M19072 Primary osteoarthritis, left ankle and foot: Secondary | ICD-10-CM | POA: Diagnosis not present

## 2020-01-14 DIAGNOSIS — F410 Panic disorder [episodic paroxysmal anxiety] without agoraphobia: Secondary | ICD-10-CM | POA: Diagnosis not present

## 2020-01-21 DIAGNOSIS — F410 Panic disorder [episodic paroxysmal anxiety] without agoraphobia: Secondary | ICD-10-CM | POA: Diagnosis not present

## 2020-01-26 DIAGNOSIS — Z113 Encounter for screening for infections with a predominantly sexual mode of transmission: Secondary | ICD-10-CM | POA: Diagnosis not present

## 2020-01-26 DIAGNOSIS — R87610 Atypical squamous cells of undetermined significance on cytologic smear of cervix (ASC-US): Secondary | ICD-10-CM | POA: Diagnosis not present

## 2020-01-26 DIAGNOSIS — R635 Abnormal weight gain: Secondary | ICD-10-CM | POA: Diagnosis not present

## 2020-01-26 DIAGNOSIS — F419 Anxiety disorder, unspecified: Secondary | ICD-10-CM | POA: Diagnosis not present

## 2020-01-26 DIAGNOSIS — Z3009 Encounter for other general counseling and advice on contraception: Secondary | ICD-10-CM | POA: Diagnosis not present

## 2020-01-26 DIAGNOSIS — E282 Polycystic ovarian syndrome: Secondary | ICD-10-CM | POA: Diagnosis not present

## 2020-01-26 DIAGNOSIS — Z202 Contact with and (suspected) exposure to infections with a predominantly sexual mode of transmission: Secondary | ICD-10-CM | POA: Diagnosis not present

## 2020-01-27 DIAGNOSIS — Z1151 Encounter for screening for human papillomavirus (HPV): Secondary | ICD-10-CM | POA: Diagnosis not present

## 2020-01-27 DIAGNOSIS — R87610 Atypical squamous cells of undetermined significance on cytologic smear of cervix (ASC-US): Secondary | ICD-10-CM | POA: Diagnosis not present

## 2020-01-27 LAB — OB RESULTS CONSOLE GC/CHLAMYDIA
Chlamydia: NEGATIVE
Gonorrhea: NEGATIVE

## 2020-01-28 DIAGNOSIS — F410 Panic disorder [episodic paroxysmal anxiety] without agoraphobia: Secondary | ICD-10-CM | POA: Diagnosis not present

## 2020-02-04 DIAGNOSIS — Z419 Encounter for procedure for purposes other than remedying health state, unspecified: Secondary | ICD-10-CM | POA: Diagnosis not present

## 2020-02-11 DIAGNOSIS — F410 Panic disorder [episodic paroxysmal anxiety] without agoraphobia: Secondary | ICD-10-CM | POA: Diagnosis not present

## 2020-02-15 ENCOUNTER — Ambulatory Visit (INDEPENDENT_AMBULATORY_CARE_PROVIDER_SITE_OTHER): Payer: Medicaid Other | Admitting: Physician Assistant

## 2020-02-15 ENCOUNTER — Other Ambulatory Visit: Payer: Self-pay

## 2020-02-15 ENCOUNTER — Encounter: Payer: Self-pay | Admitting: Physician Assistant

## 2020-02-15 VITALS — BP 108/64 | HR 80 | Temp 96.2°F | Wt 214.0 lb

## 2020-02-15 DIAGNOSIS — R197 Diarrhea, unspecified: Secondary | ICD-10-CM | POA: Diagnosis not present

## 2020-02-15 DIAGNOSIS — F419 Anxiety disorder, unspecified: Secondary | ICD-10-CM

## 2020-02-15 DIAGNOSIS — E0590 Thyrotoxicosis, unspecified without thyrotoxic crisis or storm: Secondary | ICD-10-CM | POA: Diagnosis not present

## 2020-02-15 DIAGNOSIS — R110 Nausea: Secondary | ICD-10-CM | POA: Diagnosis not present

## 2020-02-15 LAB — POCT URINE PREGNANCY: Preg Test, Ur: NEGATIVE

## 2020-02-15 MED ORDER — SUCRALFATE 1 G PO TABS: 1.0000 g | ORAL_TABLET | Freq: Three times a day (TID) | ORAL | 0 refills | Status: DC

## 2020-02-15 NOTE — Patient Instructions (Signed)
Nausea, Adult Nausea is feeling sick to your stomach or feeling that you are about to throw up (vomit). Feeling sick to your stomach is usually not serious, but it may be an early sign of a more serious medical problem. As you feel sicker to your stomach, you may throw up. If you throw up, or if you are not able to drink enough fluids, there is a risk that you may lose too much water in your body (get dehydrated). If you lose too much water in your body, you may:  Feel tired.  Feel thirsty.  Have a dry mouth.  Have cracked lips.  Go pee (urinate) less often. Older adults and people who have other diseases or a weak body defense system (immune system) have a higher risk of losing too much water in the body. The main goals of treating this condition are:  To relieve your nausea.  To ensure your nausea occurs less often.  To prevent throwing up and losing too much fluid. Follow these instructions at home: Watch your symptoms for any changes. Tell your doctor about them. Follow these instructions as told by your doctor. Eating and drinking      Take an ORS (oral rehydration solution). This is a drink that is sold at pharmacies and stores.  Drink clear fluids in small amounts as you are able. These include: ? Water. ? Ice chips. ? Fruit juice that has water added (diluted fruit juice). ? Low-calorie sports drinks.  Eat bland, easy-to-digest foods in small amounts as you are able, such as: ? Bananas. ? Applesauce. ? Rice. ? Low-fat (lean) meats. ? Toast. ? Crackers.  Avoid drinking fluids that have a lot of sugar or caffeine in them. This includes energy drinks, sports drinks, and soda.  Avoid alcohol.  Avoid spicy or fatty foods. General instructions  Take over-the-counter and prescription medicines only as told by your doctor.  Rest at home while you get better.  Drink enough fluid to keep your pee (urine) pale yellow.  Take slow and deep breaths when you feel  sick to your stomach.  Avoid food or things that have strong smells.  Wash your hands often with soap and water. If you cannot use soap and water, use hand sanitizer.  Make sure that all people in your home wash their hands well and often.  Keep all follow-up visits as told by your doctor. This is important. Contact a doctor if:  You feel sicker to your stomach.  You feel sick to your stomach for more than 2 days.  You throw up.  You are not able to drink fluids without throwing up.  You have new symptoms.  You have a fever.  You have a headache.  You have muscle cramps.  You have a rash.  You have pain while peeing.  You feel light-headed or dizzy. Get help right away if:  You have pain in your chest, neck, arm, or jaw.  You feel very weak or you pass out (faint).  You have throw up that is bright red or looks like coffee grounds.  You have bloody or black poop (stools) or poop that looks like tar.  You have a very bad headache, a stiff neck, or both.  You have very bad pain, cramping, or bloating in your belly (abdomen).  You have trouble breathing or you are breathing very quickly.  Your heart is beating very quickly.  Your skin feels cold and clammy.  You feel confused.    You have signs of losing too much water in your body, such as: ? Dark pee, very little pee, or no pee. ? Cracked lips. ? Dry mouth. ? Sunken eyes. ? Sleepiness. ? Weakness. These symptoms may be an emergency. Do not wait to see if the symptoms will go away. Get medical help right away. Call your local emergency services (911 in the [place] Do not drive yourself to the hospital. Summary  Nausea is feeling sick to your stomach or feeling that you are about to throw up (vomit).  If you throw up, or if you are not able to drink enough fluids, there is a risk that you may lose too much water in your body (get dehydrated).  Eat and drink what your doctor tells you. Take  over-the-counter and prescription medicines only as told by your doctor.  Contact a doctor right away if your symptoms get worse or you have new symptoms.  Keep all follow-up visits as told by your doctor. This is important. This information is not intended to replace advice given to you by your health care provider. Make sure you discuss any questions you have with your health care provider. Document Revised: 12/31/2017 Document Reviewed: 12/31/2017 Elsevier Patient Education  2020 Elsevier Inc.  

## 2020-02-15 NOTE — Assessment & Plan Note (Signed)
Pt reports this has been going on for several weeks.  Urine pregnancy is negative.  Will try Carafate four times a day. Recheck in four weeks.  Pt advised to call back sooner if symptoms worsening or fail to improve.

## 2020-02-15 NOTE — Progress Notes (Signed)
I,Laura E Walsh,acting as a Neurosurgeon for [employer], PA-C.,have documented all relevant documentation on the behalf of Margaretann Loveless, PA-C,as directed by  Margaretann Loveless, PA-C while in the presence of Margaretann Loveless, [place].    Established patient visit   Patient: Nicole Dyer   DOB: Jul 17, 1991   29 y.o. Female  MRN: [id number] Visit Date: 02/15/2020  Today's healthcare provider: Margaretann Loveless, PA-C   Chief Complaint  Patient presents with  . Nausea  . Emesis   Subjective    Emesis  This is a new problem. The current episode started 1 to 4 weeks ago (Started about three weeks ago.). The problem has been unchanged. There has been no fever. Associated symptoms include diarrhea. Pertinent negatives include no abdominal pain, chills, dizziness, fever, headaches, URI or weight loss. She has tried diet change for the symptoms. The treatment provided no relief.  Anxiety Presents for follow-up visit. Symptoms include decreased concentration, excessive worry, insomnia, nausea, nervous/anxious behavior and panic. Patient reports no dizziness or suicidal ideas.     Patient Active Problem List   Diagnosis Date Noted  . Hyperthyroidism 02/15/2020  . Diarrhea 02/15/2020  . Nausea 02/15/2020  . PCOS (polycystic ovarian syndrome)   . Hypothyroidism   . Anxiety   . Depression   . Normal labor 05/31/2019  . SVD (spontaneous vaginal delivery) 05/31/2019   Past Medical History:  Diagnosis Date  . Anxiety   . Depression   . Hypothyroidism   . PCOS (polycystic ovarian syndrome)    Social History   Tobacco Use  . Smoking status: Never Smoker  . Smokeless tobacco: Never Used  Vaping Use  . Vaping Use: Never used  Substance Use Topics  . Alcohol use: Never  . Drug use: Yes    Frequency: 3.0 times per week    Types: Marijuana   Allergies  Allergen Reactions  . Bee Venom Swelling  . Poison Ivy Extract Swelling     Medications: Outpatient  Medications Prior to Visit  Medication Sig  . acetaminophen (TYLENOL) 500 MG tablet Take by mouth.  . escitalopram (LEXAPRO) 10 MG tablet Take 1 tablet (10 mg total) by mouth at bedtime.  . Multiple Vitamin (MULTIVITAMIN) tablet Take 1 tablet by mouth daily.  . [DISCONTINUED] aspirin 81 MG EC tablet Take by mouth.  . [DISCONTINUED] BIOTIN MAXIMUM PO Take by mouth.  . [DISCONTINUED] calcium carbonate (OSCAL) 1500 (600 Ca) MG TABS tablet Take 300 mg by mouth daily with breakfast.   . [DISCONTINUED] ibuprofen (ADVIL) 200 MG tablet Take 200 mg by mouth every 6 (six) hours as needed.   No facility-administered medications prior to visit.    Review of Systems  Constitutional: Positive for fatigue. Negative for activity change, appetite change, chills, diaphoresis, fever, unexpected weight change and weight loss.  Gastrointestinal: Positive for abdominal distention, blood in stool, diarrhea, nausea and vomiting. Negative for abdominal pain and constipation.  Endocrine: Negative.   Neurological: Negative for dizziness, light-headedness and headaches.  Psychiatric/Behavioral: Positive for decreased concentration and sleep disturbance. Negative for dysphoric mood, self-injury and suicidal ideas. The patient is nervous/anxious and has insomnia.       Objective    BP 108/64 (BP Location: Right Arm, Patient Position: Sitting, Cuff Size: Large)   Pulse 80   Temp (!) 96.2 F (35.7 C) (Temporal)   Wt 214 lb (97.1 kg)   LMP 01/25/2020   SpO2 97%   Breastfeeding Yes   BMI 30.71 kg/m  Physical Exam Constitutional:      General: She is not in acute distress.    Appearance: Normal appearance. She is obese. She is not ill-appearing.  Cardiovascular:     Rate and Rhythm: Normal rate and regular rhythm.     Pulses: Normal pulses.     Heart sounds: Normal heart sounds.  Pulmonary:     Effort: Pulmonary effort is normal.     Breath sounds: Normal breath sounds.  Abdominal:     General:  Abdomen is flat. Bowel sounds are normal. There is no distension.     Palpations: Abdomen is soft. There is no mass.     Tenderness: There is abdominal tenderness (Generalized tenderness ). There is no guarding or rebound.     Hernia: No hernia is present.  Skin:    General: Skin is warm and dry.  Neurological:     General: No focal deficit present.     Mental Status: She is alert and oriented to person, place, and time. Mental status is at baseline.  Psychiatric:        Mood and Affect: Mood normal.        Behavior: Behavior normal.        Thought Content: Thought content normal.        Judgment: Judgment normal.       Results for orders placed or performed in visit on 02/15/20  POCT urine pregnancy  Result Value Ref Range   Preg Test, Ur Negative Negative    Assessment & Plan      Problem List Items Addressed This Visit      Endocrine   Hyperthyroidism    Overactive at last check.  Thyroid U/S 01/01/2020 was normal expect for one small nodule that does not require follow up.  Family history of thyroid carcinoma in her mother. Will recheck labs today.        Relevant Orders   T4 AND TSH   Thyroid peroxidase antibody   Thyroglobulin antibody     Other   Anxiety    Chronic and uncontrolled.  Pt is concerned the nausea vomiting and diarrhea is increasing her anxiety. Discussed increasing Lexapro, pt would like to try the Carafate and get the nausea under control first to see if it will help with her anxiety symptoms.  Will readdress in four weeks.         Diarrhea    Has been going on for several weeks.  Pt denies bloody/black tarry stools.  Will obtain stool studies to rule out infection.       Relevant Orders   Cdiff NAA+O+P+Stool Culture   Nausea - Primary    Pt reports this has been going on for several weeks.  Urine pregnancy is negative.  Will try Carafate four times a day. Recheck in four weeks.  Pt advised to call back sooner if symptoms  worsening or fail to improve.       Relevant Orders   POCT urine pregnancy (Completed)       Return in 4 weeks (on 03/14/2020).      Delmer Islam, PA-C, have reviewed all documentation for this visit. The documentation on 02/15/20 for the exam, diagnosis, procedures, and orders are all accurate and complete.   Reine Just  [facility] [phone] (phone) [phone] (fax)  [facility]

## 2020-02-15 NOTE — Assessment & Plan Note (Signed)
Has been going on for several weeks.  Pt denies bloody/black tarry stools.  Will obtain stool studies to rule out infection.

## 2020-02-15 NOTE — Assessment & Plan Note (Addendum)
Overactive at last check.  Thyroid U/S 01/01/2020 was normal expect for one small nodule that does not require follow up.  Family history of thyroid carcinoma in her mother. Will recheck labs today.

## 2020-02-15 NOTE — Assessment & Plan Note (Addendum)
Chronic and uncontrolled.  Pt is concerned the nausea vomiting and diarrhea is increasing her anxiety. Discussed increasing Lexapro, pt would like to try the Carafate and get the nausea under control first to see if it will help with her anxiety symptoms.  Will readdress in four weeks.

## 2020-02-17 LAB — THYROID PEROXIDASE ANTIBODY: Thyroperoxidase Ab SerPl-aCnc: <8 IU/mL (ref 0–34)

## 2020-02-17 LAB — T4 AND TSH
T4, Total: 6.8 ug/dL (ref 4.5–12.0)
TSH: 0.93 u[IU]/mL (ref 0.450–4.500)

## 2020-02-17 LAB — THYROGLOBULIN ANTIBODY: Thyroglobulin Antibody: 3.3 IU/mL — ABNORMAL HIGH (ref 0.0–0.9)

## 2020-02-18 ENCOUNTER — Telehealth: Payer: Self-pay

## 2020-02-18 NOTE — Telephone Encounter (Signed)
-----   Message from Margaretann Loveless, [place] sent at 02/17/2020  2:02 PM EDT ----- Thyroid is normal range currently. Negative TPO antibodies which rules out Hashimotos. 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 Would recommend endocrine referral.

## 2020-02-18 NOTE — Telephone Encounter (Signed)
LMTCB-If patient calls back oK for [facility] nurse to give results.

## 2020-02-19 NOTE — Telephone Encounter (Signed)
Patient called, left VM to return the call to clarify if she received lab results and ok with referral as noted.

## 2020-02-19 NOTE — Telephone Encounter (Signed)
Nicole Dyer I don't understand this message. Did the patient agree to the referall? Was she advised of the results.

## 2020-02-19 NOTE — Telephone Encounter (Signed)
Pt called office per instruction by Okey Regal the Triage RN.  CB# [phone]

## 2020-02-19 NOTE — Telephone Encounter (Signed)
Phone call to pt.  Left message to return call to the office to receive recommendations per [facility].

## 2020-02-22 DIAGNOSIS — R197 Diarrhea, unspecified: Secondary | ICD-10-CM | POA: Diagnosis not present

## 2020-02-22 NOTE — Telephone Encounter (Signed)
"  Patient called, left VM to return the call to clarify if she received lab results and ok with referral as noted."

## 2020-02-22 NOTE — Addendum Note (Signed)
Addended by: Marjie Skiff on: 02/22/2020 09:25 AM   Modules accepted: Orders

## 2020-02-23 DIAGNOSIS — F410 Panic disorder [episodic paroxysmal anxiety] without agoraphobia: Secondary | ICD-10-CM | POA: Diagnosis not present

## 2020-02-29 ENCOUNTER — Telehealth: Payer: Self-pay

## 2020-02-29 LAB — CDIFF NAA+O+P+STOOL CULTURE
E coli, Shiga toxin Assay: NEGATIVE
Toxigenic C. Difficile by PCR: NEGATIVE

## 2020-02-29 NOTE — Telephone Encounter (Signed)
Stool cultures are completely negative.  Written by Margaretann Loveless, PA-C on 02/29/2020 11:46 AM EDT Seen by patient Nicole Dyer on 02/29/2020 12:11 PM

## 2020-02-29 NOTE — Telephone Encounter (Signed)
-----   Message from Margaretann Loveless, PA-C sent at 02/29/2020 11:46 AM EDT ----- Stool cultures are completely negative.

## 2020-03-01 ENCOUNTER — Encounter: Payer: Self-pay | Admitting: Physician Assistant

## 2020-03-06 DIAGNOSIS — Z419 Encounter for procedure for purposes other than remedying health state, unspecified: Secondary | ICD-10-CM | POA: Diagnosis not present

## 2020-03-10 DIAGNOSIS — F410 Panic disorder [episodic paroxysmal anxiety] without agoraphobia: Secondary | ICD-10-CM | POA: Diagnosis not present

## 2020-03-17 ENCOUNTER — Ambulatory Visit: Payer: Medicaid Other | Admitting: Physician Assistant

## 2020-03-25 ENCOUNTER — Other Ambulatory Visit: Payer: Self-pay

## 2020-03-25 ENCOUNTER — Ambulatory Visit: Payer: Medicaid Other | Admitting: Physician Assistant

## 2020-03-25 ENCOUNTER — Encounter: Payer: Self-pay | Admitting: Physician Assistant

## 2020-03-25 VITALS — BP 98/65 | HR 79 | Temp 98.3°F | Resp 16 | Wt 212.2 lb

## 2020-03-25 DIAGNOSIS — F419 Anxiety disorder, unspecified: Secondary | ICD-10-CM

## 2020-03-25 DIAGNOSIS — R140 Abdominal distension (gaseous): Secondary | ICD-10-CM

## 2020-03-25 DIAGNOSIS — O99345 Other mental disorders complicating the puerperium: Secondary | ICD-10-CM

## 2020-03-25 DIAGNOSIS — F53 Postpartum depression: Secondary | ICD-10-CM

## 2020-03-25 DIAGNOSIS — F410 Panic disorder [episodic paroxysmal anxiety] without agoraphobia: Secondary | ICD-10-CM | POA: Diagnosis not present

## 2020-03-25 DIAGNOSIS — R110 Nausea: Secondary | ICD-10-CM | POA: Diagnosis not present

## 2020-03-25 MED ORDER — ESCITALOPRAM OXALATE 20 MG PO TABS: 20.0000 mg | ORAL_TABLET | Freq: Every day | ORAL | 1 refills | Status: DC

## 2020-03-25 NOTE — Progress Notes (Signed)
Established patient visit   Patient: Nicole Dyer   DOB: [birth year]   29 y.o. Female  MRN: [id number] Visit Date: 03/25/2020  Today's healthcare provider: Margaretann Loveless, PA-C   No chief complaint on file.  Subjective    HPI  Follow up for Anxiety  The patient was last seen for this 4 weeks ago. Changes made at last visit include none, Discussed increasing Lexapro, pt would like to try the Carafate and get the nausea under control first to see if it will help with her anxiety symptoms. She feels like she still have situational stress. She feels like the medicine is working at a certain level. She feels lethargic and her abdomen still hurts. She is having anxious thoughts.   She reports good compliance with treatment. She feels that condition is Improved. She is not having side effects.   -----------------------------------------------------------------------------------------   Patient Active Problem List   Diagnosis Date Noted  . Hyperthyroidism 02/15/2020  . Diarrhea 02/15/2020  . Nausea 02/15/2020  . PCOS (polycystic ovarian syndrome)   . Hypothyroidism   . Anxiety   . Depression   . Normal labor 05/31/2019  . SVD (spontaneous vaginal delivery) 05/31/2019   Past Medical History:  Diagnosis Date  . Anxiety   . Depression   . Hypothyroidism   . PCOS (polycystic ovarian syndrome)        Medications: Outpatient Medications Prior to Visit  Medication Sig  . acetaminophen (TYLENOL) 500 MG tablet Take by mouth.  . escitalopram (LEXAPRO) 10 MG tablet Take 1 tablet (10 mg total) by mouth at bedtime.  . Multiple Vitamin (MULTIVITAMIN) tablet Take 1 tablet by mouth daily.  . sucralfate (CARAFATE) 1 g tablet Take 1 tablet (1 g total) by mouth 4 (four) times daily -  with meals and at bedtime.   No facility-administered medications prior to visit.    Review of Systems  Constitutional: Positive for fatigue.  Respiratory: Negative.   Cardiovascular:  Negative.   Neurological: Negative.   Psychiatric/Behavioral: Positive for agitation, dysphoric mood and sleep disturbance.    Last CBC Lab Results  Component Value Date   WBC 7.6 11/13/2019   HGB 12.5 11/13/2019   HCT 36.6 11/13/2019   MCV 84 11/13/2019   MCH 28.7 11/13/2019   RDW 11.8 11/13/2019   PLT 258 11/13/2019   Last metabolic panel Lab Results  Component Value Date   GLUCOSE 80 11/13/2019   NA 141 11/13/2019   K 4.0 11/13/2019   CL 104 11/13/2019   CO2 23 11/13/2019   BUN 8 11/13/2019   CREATININE 0.73 11/13/2019   GFRNONAA 112 11/13/2019   GFRAA 130 11/13/2019   CALCIUM 9.3 11/13/2019   PROT 6.8 11/13/2019   ALBUMIN 4.4 11/13/2019   LABGLOB 2.4 11/13/2019   AGRATIO 1.8 11/13/2019   BILITOT 0.4 11/13/2019   ALKPHOS 95 11/13/2019   AST 15 11/13/2019   ALT 13 11/13/2019      Objective    BP 98/65 (BP Location: Left Arm, Patient Position: Sitting, Cuff Size: Large)   Pulse 79   Temp 98.3 F (36.8 C) (Oral)   Resp 16   Wt 212 lb 3.2 oz (96.3 kg)   BMI 30.45 kg/m  BP Readings from Last 3 Encounters:  03/25/20 98/65  02/15/20 108/64  12/18/19 119/73   Wt Readings from Last 3 Encounters:  03/25/20 212 lb 3.2 oz (96.3 kg)  02/15/20 214 lb (97.1 kg)  12/18/19 218 lb (98.9 kg)  Physical Exam Vitals reviewed.  Constitutional:      General: She is not in acute distress.    Appearance: Normal appearance. She is well-developed. She is not ill-appearing or diaphoretic.  Cardiovascular:     Rate and Rhythm: Normal rate and regular rhythm.     Heart sounds: Normal heart sounds. No murmur heard.  No friction rub. No gallop.   Pulmonary:     Effort: Pulmonary effort is normal. No respiratory distress.     Breath sounds: Normal breath sounds. No wheezing or rales.  Musculoskeletal:     Cervical back: Normal range of motion and neck supple.  Neurological:     Mental Status: She is alert.      Depression screen [facility] 2/9 03/25/2020 02/15/2020  12/18/2019 11/13/2019  Decreased Interest 2 2 1 3   Down, Depressed, Hopeless 1 1 1 3   PHQ - 2 Score 3 3 2 6   Altered sleeping 2 3 2 3   Tired, decreased energy 3 3 1 3   Change in appetite 3 2 2 2   Feeling bad or failure about yourself  1 1 0 3  Trouble concentrating 1 2 0 2  Moving slowly or fidgety/restless 0 1 0 1  Suicidal thoughts 0 0 0 2  PHQ-9 Score 13 15 7 22   Difficult doing work/chores Very difficult Very difficult Somewhat difficult Extremely dIfficult    No results found for any visits on 03/25/20.  Assessment & Plan     1. Anxiety Improving but not to goal. Will increase escitalopram as below. F/U in 4-6 weeks.  - escitalopram (LEXAPRO) 20 MG tablet; Take 1 tablet (20 mg total) by mouth daily.  Dispense: 90 tablet; Refill: 1  2. Postpartum depression See above medical treatment plan. - escitalopram (LEXAPRO) 20 MG tablet; Take 1 tablet (20 mg total) by mouth daily.  Dispense: 90 tablet; Refill: 1  3. Nausea Not improving despite sucralfate. Suspect PUD, gastritis. May be atypical gallbladder presentation. Not associated with food/eating. Will refer to GI for further evaluation.  - Ambulatory referral to Gastroenterology  4. Abdominal bloating See above medical treatment plan. - Ambulatory referral to Gastroenterology   No follow-ups on file.      , PA-C, have reviewed all documentation for this visit. The documentation on 03/30/20 for the exam, diagnosis, procedures, and orders are all accurate and complete.    [facility] [phone] (phone) [phone] (fax)  [facility]

## 2020-03-29 DIAGNOSIS — M19172 Post-traumatic osteoarthritis, left ankle and foot: Secondary | ICD-10-CM | POA: Diagnosis not present

## 2020-03-29 DIAGNOSIS — M25872 Other specified joint disorders, left ankle and foot: Secondary | ICD-10-CM | POA: Diagnosis not present

## 2020-03-29 DIAGNOSIS — M25572 Pain in left ankle and joints of left foot: Secondary | ICD-10-CM | POA: Diagnosis not present

## 2020-03-30 ENCOUNTER — Encounter: Payer: Self-pay | Admitting: Physician Assistant

## 2020-04-06 DIAGNOSIS — Z419 Encounter for procedure for purposes other than remedying health state, unspecified: Secondary | ICD-10-CM | POA: Diagnosis not present

## 2020-04-06 DIAGNOSIS — F410 Panic disorder [episodic paroxysmal anxiety] without agoraphobia: Secondary | ICD-10-CM | POA: Diagnosis not present

## 2020-04-21 DIAGNOSIS — F410 Panic disorder [episodic paroxysmal anxiety] without agoraphobia: Secondary | ICD-10-CM | POA: Diagnosis not present

## 2020-04-21 DIAGNOSIS — Z3201 Encounter for pregnancy test, result positive: Secondary | ICD-10-CM | POA: Diagnosis not present

## 2020-04-21 DIAGNOSIS — Z3A12 12 weeks gestation of pregnancy: Secondary | ICD-10-CM | POA: Diagnosis not present

## 2020-04-21 DIAGNOSIS — O26891 Other specified pregnancy related conditions, first trimester: Secondary | ICD-10-CM | POA: Diagnosis not present

## 2020-04-21 DIAGNOSIS — Z23 Encounter for immunization: Secondary | ICD-10-CM | POA: Diagnosis not present

## 2020-04-21 DIAGNOSIS — N911 Secondary amenorrhea: Secondary | ICD-10-CM | POA: Diagnosis not present

## 2020-04-21 DIAGNOSIS — Z7189 Other specified counseling: Secondary | ICD-10-CM | POA: Diagnosis not present

## 2020-04-21 DIAGNOSIS — O3680X0 Pregnancy with inconclusive fetal viability, not applicable or unspecified: Secondary | ICD-10-CM | POA: Diagnosis not present

## 2020-04-22 ENCOUNTER — Telehealth (INDEPENDENT_AMBULATORY_CARE_PROVIDER_SITE_OTHER): Payer: Medicaid Other | Admitting: Physician Assistant

## 2020-04-22 ENCOUNTER — Encounter: Payer: Self-pay | Admitting: Physician Assistant

## 2020-04-22 DIAGNOSIS — Z3A12 12 weeks gestation of pregnancy: Secondary | ICD-10-CM

## 2020-04-22 DIAGNOSIS — O99345 Other mental disorders complicating the puerperium: Secondary | ICD-10-CM | POA: Diagnosis not present

## 2020-04-22 DIAGNOSIS — F53 Postpartum depression: Secondary | ICD-10-CM | POA: Diagnosis not present

## 2020-04-22 DIAGNOSIS — S82852S Displaced trimalleolar fracture of left lower leg, sequela: Secondary | ICD-10-CM | POA: Diagnosis not present

## 2020-04-22 MED ORDER — SERTRALINE HCL 100 MG PO TABS: 100.0000 mg | ORAL_TABLET | Freq: Every day | ORAL | 0 refills | Status: DC

## 2020-04-22 NOTE — Patient Instructions (Signed)
Sertraline tablets What is this medicine? SERTRALINE (SER tra leen) is used to treat depression. It may also be used to treat obsessive compulsive disorder, panic disorder, post-trauma stress, premenstrual dysphoric disorder (PMDD) or social anxiety. This medicine may be used for other purposes; ask your health care provider or pharmacist if you have questions. COMMON BRAND NAME(S): Zoloft What should I tell my health care provider before I take this medicine? They need to know if you have any of these conditions:  bleeding disorders  bipolar disorder or a family history of bipolar disorder  glaucoma  heart disease  high blood pressure  history of irregular heartbeat  history of low levels of calcium, magnesium, or potassium in the blood  if you often drink alcohol  liver disease  receiving electroconvulsive therapy  seizures  suicidal thoughts, plans, or attempt; a previous suicide attempt by you or a family member  take medicines that treat or prevent blood clots  thyroid disease  an unusual or allergic reaction to sertraline, other medicines, foods, dyes, or preservatives  pregnant or trying to get pregnant  breast-feeding How should I use this medicine? Take this medicine by mouth with a glass of water. Follow the directions on the prescription label. You can take it with or without food. Take your medicine at regular intervals. Do not take your medicine more often than directed. Do not stop taking this medicine suddenly except upon the advice of your doctor. Stopping this medicine too quickly may cause serious side effects or your condition may worsen. A special MedGuide will be given to you by the pharmacist with each prescription and refill. Be sure to read this information carefully each time. Talk to your pediatrician regarding the use of this medicine in children. While this drug may be prescribed for children as young as 7 years for selected conditions,  precautions do apply. Overdosage: If you think you have taken too much of this medicine contact a poison control center or emergency room at once. NOTE: This medicine is only for you. Do not share this medicine with others. What if I miss a dose? If you miss a dose, take it as soon as you can. If it is almost time for your next dose, take only that dose. Do not take double or extra doses. What may interact with this medicine? Do not take this medicine with any of the following medications:  cisapride  dronedarone  linezolid  MAOIs like Carbex, Eldepryl, Marplan, Nardil, and Parnate  methylene blue (injected into a vein)  pimozide  thioridazine This medicine may also interact with the following medications:  alcohol  amphetamines  aspirin and aspirin-like medicines  certain medicines for depression, anxiety, or psychotic disturbances  certain medicines for fungal infections like ketoconazole, fluconazole, posaconazole, and itraconazole  certain medicines for irregular heart beat like flecainide, quinidine, propafenone  certain medicines for migraine headaches like almotriptan, eletriptan, frovatriptan, naratriptan, rizatriptan, sumatriptan, zolmitriptan  certain medicines for sleep  certain medicines for seizures like carbamazepine, valproic acid, phenytoin  certain medicines that treat or prevent blood clots like warfarin, enoxaparin, dalteparin  cimetidine  digoxin  diuretics  fentanyl  isoniazid  lithium  NSAIDs, medicines for pain and inflammation, like ibuprofen or naproxen  other medicines that prolong the QT interval (cause an abnormal heart rhythm) like dofetilide  rasagiline  safinamide  supplements like St. John's wort, kava kava, valerian  tolbutamide  tramadol  tryptophan This list may not describe all possible interactions. Give your health care provider   a list of all the medicines, herbs, non-prescription drugs, or dietary supplements  you use. Also tell them if you smoke, drink alcohol, or use illegal drugs. Some items may interact with your medicine. What should I watch for while using this medicine? Tell your doctor if your symptoms do not get better or if they get worse. Visit your doctor or health care professional for regular checks on your progress. Because it may take several weeks to see the full effects of this medicine, it is important to continue your treatment as prescribed by your doctor. Patients and their families should watch out for new or worsening thoughts of suicide or depression. Also watch out for sudden changes in feelings such as feeling anxious, agitated, panicky, irritable, hostile, aggressive, impulsive, severely restless, overly excited and hyperactive, or not being able to sleep. If this happens, especially at the beginning of treatment or after a change in dose, call your health care professional. You may get drowsy or dizzy. Do not drive, use machinery, or do anything that needs mental alertness until you know how this medicine affects you. Do not stand or sit up quickly, especially if you are an older patient. This reduces the risk of dizzy or fainting spells. Alcohol may interfere with the effect of this medicine. Avoid alcoholic drinks. Your mouth may get dry. Chewing sugarless gum or sucking hard candy, and drinking plenty of water may help. Contact your doctor if the problem does not go away or is severe. What side effects may I notice from receiving this medicine? Side effects that you should report to your doctor or health care professional as soon as possible:  allergic reactions like skin rash, itching or hives, swelling of the face, lips, or tongue  anxious  black, tarry stools  changes in vision  confusion  elevated mood, decreased need for sleep, racing thoughts, impulsive behavior  eye pain  fast, irregular heartbeat  feeling faint or lightheaded, falls  feeling agitated,  angry, or irritable  hallucination, loss of contact with reality  loss of balance or coordination  loss of memory  painful or prolonged erections  restlessness, pacing, inability to keep still  seizures  stiff muscles  suicidal thoughts or other mood changes  trouble sleeping  unusual bleeding or bruising  unusually weak or tired  vomiting Side effects that usually do not require medical attention (report to your doctor or health care professional if they continue or are bothersome):  change in appetite or weight  change in sex drive or performance  diarrhea  increased sweating  indigestion, nausea  tremors This list may not describe all possible side effects. Call your doctor for medical advice about side effects. You may report side effects to FDA at 1-800-FDA-1088. Where should I keep my medicine? Keep out of the reach of children. Store at room temperature between 15 and 30 degrees C (59 and 86 degrees F). Throw away any unused medicine after the expiration date. NOTE: This sheet is a summary. It may not cover all possible information. If you have questions about this medicine, talk to your doctor, pharmacist, or health care provider.  2020 Elsevier/Gold Standard (2018-07-15 10:09:27)  
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
## 2020-04-27 DIAGNOSIS — B349 Viral infection, unspecified: Secondary | ICD-10-CM | POA: Diagnosis not present

## 2020-04-27 DIAGNOSIS — R0981 Nasal congestion: Secondary | ICD-10-CM | POA: Diagnosis not present

## 2020-04-27 DIAGNOSIS — Z20822 Contact with and (suspected) exposure to covid-19: Secondary | ICD-10-CM | POA: Diagnosis not present

## 2020-05-03 DIAGNOSIS — Z3A39 39 weeks gestation of pregnancy: Secondary | ICD-10-CM | POA: Diagnosis not present

## 2020-05-03 DIAGNOSIS — E039 Hypothyroidism, unspecified: Secondary | ICD-10-CM | POA: Diagnosis not present

## 2020-05-03 DIAGNOSIS — Z3481 Encounter for supervision of other normal pregnancy, first trimester: Secondary | ICD-10-CM | POA: Diagnosis not present

## 2020-05-03 DIAGNOSIS — Z3A14 14 weeks gestation of pregnancy: Secondary | ICD-10-CM | POA: Diagnosis not present

## 2020-05-03 DIAGNOSIS — E282 Polycystic ovarian syndrome: Secondary | ICD-10-CM | POA: Diagnosis not present

## 2020-05-03 DIAGNOSIS — O99343 Other mental disorders complicating pregnancy, third trimester: Secondary | ICD-10-CM | POA: Diagnosis not present

## 2020-05-03 DIAGNOSIS — Z113 Encounter for screening for infections with a predominantly sexual mode of transmission: Secondary | ICD-10-CM | POA: Diagnosis not present

## 2020-05-03 DIAGNOSIS — Z369 Encounter for antenatal screening, unspecified: Secondary | ICD-10-CM | POA: Diagnosis not present

## 2020-05-03 DIAGNOSIS — F410 Panic disorder [episodic paroxysmal anxiety] without agoraphobia: Secondary | ICD-10-CM | POA: Diagnosis not present

## 2020-05-03 LAB — OB RESULTS CONSOLE ANTIBODY SCREEN: Antibody Screen: NEGATIVE

## 2020-05-03 LAB — OB RESULTS CONSOLE ABO/RH: RH Type: POSITIVE

## 2020-05-03 LAB — OB RESULTS CONSOLE RPR
RPR: NONREACTIVE
RPR: NONREACTIVE

## 2020-05-03 LAB — OB RESULTS CONSOLE VARICELLA ZOSTER ANTIBODY, IGG: Varicella: IMMUNE

## 2020-05-03 LAB — OB RESULTS CONSOLE RUBELLA ANTIBODY, IGM
Rubella: IMMUNE
Rubella: IMMUNE

## 2020-05-03 LAB — OB RESULTS CONSOLE GC/CHLAMYDIA
Chlamydia: NEGATIVE
Chlamydia: POSITIVE
Gonorrhea: NEGATIVE
Gonorrhea: NEGATIVE

## 2020-05-03 LAB — OB RESULTS CONSOLE HEPATITIS B SURFACE ANTIGEN
Hepatitis B Surface Ag: NEGATIVE
Hepatitis B Surface Ag: NEGATIVE

## 2020-05-03 LAB — OB RESULTS CONSOLE HIV ANTIBODY (ROUTINE TESTING)
HIV: NONREACTIVE
HIV: NONREACTIVE

## 2020-05-06 DIAGNOSIS — Z419 Encounter for procedure for purposes other than remedying health state, unspecified: Secondary | ICD-10-CM | POA: Diagnosis not present

## 2020-05-12 DIAGNOSIS — Z20822 Contact with and (suspected) exposure to covid-19: Secondary | ICD-10-CM | POA: Diagnosis not present

## 2020-05-12 DIAGNOSIS — Z87891 Personal history of nicotine dependence: Secondary | ICD-10-CM | POA: Diagnosis not present

## 2020-05-12 DIAGNOSIS — R0602 Shortness of breath: Secondary | ICD-10-CM | POA: Diagnosis not present

## 2020-05-12 DIAGNOSIS — O219 Vomiting of pregnancy, unspecified: Secondary | ICD-10-CM | POA: Diagnosis not present

## 2020-05-12 DIAGNOSIS — O210 Mild hyperemesis gravidarum: Secondary | ICD-10-CM | POA: Diagnosis not present

## 2020-05-12 DIAGNOSIS — R109 Unspecified abdominal pain: Secondary | ICD-10-CM | POA: Diagnosis not present

## 2020-05-12 DIAGNOSIS — Z3A15 15 weeks gestation of pregnancy: Secondary | ICD-10-CM | POA: Diagnosis not present

## 2020-05-20 DIAGNOSIS — F410 Panic disorder [episodic paroxysmal anxiety] without agoraphobia: Secondary | ICD-10-CM | POA: Diagnosis not present

## 2020-05-30 DIAGNOSIS — F410 Panic disorder [episodic paroxysmal anxiety] without agoraphobia: Secondary | ICD-10-CM | POA: Diagnosis not present

## 2020-06-06 DIAGNOSIS — E039 Hypothyroidism, unspecified: Secondary | ICD-10-CM | POA: Diagnosis not present

## 2020-06-06 DIAGNOSIS — Z3A18 18 weeks gestation of pregnancy: Secondary | ICD-10-CM | POA: Diagnosis not present

## 2020-06-06 DIAGNOSIS — O99343 Other mental disorders complicating pregnancy, third trimester: Secondary | ICD-10-CM | POA: Diagnosis not present

## 2020-06-06 DIAGNOSIS — Z3A39 39 weeks gestation of pregnancy: Secondary | ICD-10-CM | POA: Diagnosis not present

## 2020-06-06 DIAGNOSIS — E282 Polycystic ovarian syndrome: Secondary | ICD-10-CM | POA: Diagnosis not present

## 2020-06-06 DIAGNOSIS — O2392 Unspecified genitourinary tract infection in pregnancy, second trimester: Secondary | ICD-10-CM | POA: Diagnosis not present

## 2020-06-06 DIAGNOSIS — Z3689 Encounter for other specified antenatal screening: Secondary | ICD-10-CM | POA: Diagnosis not present

## 2020-06-06 DIAGNOSIS — Z419 Encounter for procedure for purposes other than remedying health state, unspecified: Secondary | ICD-10-CM | POA: Diagnosis not present

## 2020-06-06 LAB — OB RESULTS CONSOLE GC/CHLAMYDIA
Chlamydia: NEGATIVE
Gonorrhea: NEGATIVE

## 2020-06-21 ENCOUNTER — Other Ambulatory Visit: Payer: Self-pay

## 2020-06-22 ENCOUNTER — Ambulatory Visit: Payer: Self-pay | Admitting: Gastroenterology

## 2020-06-29 ENCOUNTER — Other Ambulatory Visit: Payer: Self-pay | Admitting: Physician Assistant

## 2020-06-29 DIAGNOSIS — F53 Postpartum depression: Secondary | ICD-10-CM

## 2020-07-05 DIAGNOSIS — O99282 Endocrine, nutritional and metabolic diseases complicating pregnancy, second trimester: Secondary | ICD-10-CM | POA: Diagnosis not present

## 2020-07-05 DIAGNOSIS — Z3A23 23 weeks gestation of pregnancy: Secondary | ICD-10-CM | POA: Diagnosis not present

## 2020-07-05 DIAGNOSIS — Z362 Encounter for other antenatal screening follow-up: Secondary | ICD-10-CM | POA: Diagnosis not present

## 2020-07-05 DIAGNOSIS — K219 Gastro-esophageal reflux disease without esophagitis: Secondary | ICD-10-CM | POA: Diagnosis not present

## 2020-07-05 DIAGNOSIS — O99342 Other mental disorders complicating pregnancy, second trimester: Secondary | ICD-10-CM | POA: Diagnosis not present

## 2020-07-06 DIAGNOSIS — Z419 Encounter for procedure for purposes other than remedying health state, unspecified: Secondary | ICD-10-CM | POA: Diagnosis not present

## 2020-07-13 DIAGNOSIS — F410 Panic disorder [episodic paroxysmal anxiety] without agoraphobia: Secondary | ICD-10-CM | POA: Diagnosis not present

## 2020-07-25 DIAGNOSIS — F410 Panic disorder [episodic paroxysmal anxiety] without agoraphobia: Secondary | ICD-10-CM | POA: Diagnosis not present

## 2020-08-04 DIAGNOSIS — O99342 Other mental disorders complicating pregnancy, second trimester: Secondary | ICD-10-CM | POA: Diagnosis not present

## 2020-08-04 DIAGNOSIS — Z3689 Encounter for other specified antenatal screening: Secondary | ICD-10-CM | POA: Diagnosis not present

## 2020-08-04 DIAGNOSIS — Z3A27 27 weeks gestation of pregnancy: Secondary | ICD-10-CM | POA: Diagnosis not present

## 2020-08-04 DIAGNOSIS — O99282 Endocrine, nutritional and metabolic diseases complicating pregnancy, second trimester: Secondary | ICD-10-CM | POA: Diagnosis not present

## 2020-08-04 DIAGNOSIS — Z23 Encounter for immunization: Secondary | ICD-10-CM | POA: Diagnosis not present

## 2020-08-06 DIAGNOSIS — Z419 Encounter for procedure for purposes other than remedying health state, unspecified: Secondary | ICD-10-CM | POA: Diagnosis not present

## 2020-08-06 NOTE — L&D Delivery Note (Signed)
Delivery Note Pt received epidural and progressed rapidly to complete dilation.  AROM done just prior to delivery. At 1:10 PM a healthy female was delivered via Vaginal, Spontaneous (Presentation: Right Occiput Anterior).  APGAR: 9, 9; weight pending .   Placenta status: Spontaneous, Intact.  Cord: 3 vessels with the following complications:  loose true knot   Anesthesia: Epidural Episiotomy:  none Lacerations:  Periurethral abrasion repaired for hemostasis Suture Repair: 3.0 vicryl Est. Blood Loss (mL):   Mom to postpartum.  Baby to Couplet care / Skin to Skin. D/w parents circumcision and they desire to proceed.  Oliver Pila 11/02/2020, 1:29 PM

## 2020-08-09 DIAGNOSIS — F410 Panic disorder [episodic paroxysmal anxiety] without agoraphobia: Secondary | ICD-10-CM | POA: Diagnosis not present

## 2020-08-10 ENCOUNTER — Encounter: Payer: Self-pay | Admitting: Gastroenterology

## 2020-08-10 ENCOUNTER — Ambulatory Visit (INDEPENDENT_AMBULATORY_CARE_PROVIDER_SITE_OTHER): Payer: Medicaid Other | Admitting: Gastroenterology

## 2020-08-10 VITALS — BP 114/71 | HR 83 | Temp 97.7°F | Ht 70.0 in | Wt 232.0 lb

## 2020-08-10 DIAGNOSIS — R1013 Epigastric pain: Secondary | ICD-10-CM | POA: Diagnosis not present

## 2020-08-10 DIAGNOSIS — K649 Unspecified hemorrhoids: Secondary | ICD-10-CM

## 2020-08-10 MED ORDER — FAMOTIDINE 20 MG PO TABS: 20.0000 mg | ORAL_TABLET | Freq: Every day | ORAL | 0 refills | Status: DC

## 2020-08-10 MED ORDER — HYDROCORTISONE 1 % EX OINT: 1.0000 "application " | TOPICAL_OINTMENT | Freq: Every day | CUTANEOUS | 0 refills | Status: AC

## 2020-08-10 MED ORDER — PSYLLIUM 58.6 % PO POWD: 1.0000 | Freq: Every day | ORAL | 0 refills | Status: AC

## 2020-08-10 NOTE — Progress Notes (Signed)
me

## 2020-08-10 NOTE — Patient Instructions (Signed)

## 2020-08-11 NOTE — Progress Notes (Signed)
Nicole Dyer [address]  [address]  [place], [place] [address]  Main: [phone]  Fax: [phone]   Gastroenterology Consultation  Referring Provider:     Suella Grove* Primary Care Physician:  Reine Just Reason for Consultation:     abdominal bloating        HPI:    Chief Complaint  Patient presents with  . Bloated    Nicole Dyer is a 30 y.o. y/o female referred for consultation & management  by Dr. Rosezetta Schlatter, Alessandra Bevels, PA-C.  Patient is currently 7 months pregnant and presents for multiple complaints including problems with hemorrhoids, abdominal bloating, heartburn and indigestion.  Patient reports having evaluation with a surgeon for her hemorrhoids after her first pregnancy and conservative management was recommended.  At this time her symptoms include perianal itching from her hemorrhoids.  Hemoglobin is normal.  Patient denies any blood in stool.  Does intermittently strain with her bowel movements.  Has been trying over-the-counter laxatives which leads to loose to soft stools sometimes  She does report heartburn and is taking Carafate which has helped.  During her previous pregnancy she was on Zantac which relieved her symptoms at that time.  Did take Prilosec for 14 days which improved her symptoms while she was taking it.  No dysphagia.  No prior EGD or colonoscopy  Past Medical History:  Diagnosis Date  . Anxiety   . Depression   . Hypothyroidism   . PCOS (polycystic ovarian syndrome)     Past Surgical History:  Procedure Laterality Date  . ANKLE SURGERY Left     Prior to Admission medications   Medication Sig Start Date End Date Taking? Authorizing Provider  acetaminophen (TYLENOL) 500 MG tablet Take by mouth.   Yes [provider]  escitalopram (LEXAPRO) 20 MG tablet Take 20 mg by mouth daily. 06/16/20  Yes [provider]  famotidine (PEPCID) 20 MG tablet Take 1 tablet (20 mg total) by mouth  daily. 08/10/20  Yes Nicole Dyer B, MD  hydrocortisone 1 % ointment Apply 1 application topically at bedtime. 08/10/20  Yes Pasty Spillers, MD  Prenatal Vit-Fe Fumarate-FA (PNV PRENATAL PLUS MULTIVITAMIN) 27-1 MG TABS Take 1 tablet by mouth daily.   Yes [provider]  psyllium (METAMUCIL) 58.6 % powder Take 1 packet by mouth daily. 08/10/20  Yes Nicole Dyer B, MD  sertraline (ZOLOFT) 100 MG tablet TAKE 1 TABLET(100 MG) BY MOUTH AT BEDTIME 06/29/20  Yes Joycelyn Man M, PA-C  sucralfate (CARAFATE) 1 g tablet Take 1 tablet by mouth 4 (four) times daily as needed. 02/15/20  Yes [provider]  Biotin 5 MG CAPS Take 1 capsule by mouth daily. Patient not taking: Reported on 08/10/2020    [provider]  calcium carbonate (OSCAL) 1500 (600 Ca) MG TABS tablet Take 1 tablet by mouth daily as needed. Patient not taking: Reported on 08/10/2020    [provider]    Family History  Problem Relation Age of Onset  . Cancer Mother        thyroid  . Cancer Father   . Hypertension Father      Social History   Tobacco Use  . Smoking status: Never Smoker  . Smokeless tobacco: Never Used  Vaping Use  . Vaping Use: Never used  Substance Use Topics  . Alcohol use: Never  . Drug use: Yes    Frequency: 3.0 times per week    Types: Marijuana  Allergies as of 08/10/2020 - Review Complete 08/10/2020  Allergen Reaction Noted  . Poison ivy extract Swelling and Rash 04/01/2019  . Bee venom Swelling 05/04/2019    Review of Systems:    All systems reviewed and negative except where noted in HPI.   Physical Exam:  BP 114/71   Pulse 83   Temp 97.7 F (36.5 C) (Oral)   Ht 5\' 10"  (1.778 m)   Wt 232 lb (105.2 kg)   BMI 33.29 kg/m  No LMP recorded. Psych:  Alert and cooperative. Normal mood and affect. General:   Alert,  Well-developed, well-nourished, pleasant and cooperative in NAD Head:  Normocephalic and atraumatic. Eyes:  Sclera clear,  no icterus.   Conjunctiva pink. Ears:  Normal auditory acuity. Nose:  No deformity, discharge, or lesions. Mouth:  No deformity or lesions,oropharynx pink & moist. Neck:  Supple; no masses or thyromegaly. Abdomen:  Normal bowel sounds.  No bruits.  Soft, non-tender and gravid abdomen without masses, hepatosplenomegaly or hernias noted.  No guarding or rebound tenderness.    Rectal exam: Nonbleeding external hemorrhoid present, no masses in rectal vault Msk:  Symmetrical without gross deformities. Good, equal movement & strength bilaterally. Pulses:  Normal pulses noted. Extremities:  No clubbing or edema.  No cyanosis. Neurologic:  Alert and oriented x3;  grossly normal neurologically. Skin:  Intact without significant lesions or rashes. No jaundice. Lymph Nodes:  No significant cervical adenopathy. Psych:  Alert and cooperative. Normal mood and affect.   Labs: CBC    Component Value Date/Time   WBC 7.6 11/13/2019 1521   WBC 10.7 (H) 06/01/2019 0530   RBC 4.36 11/13/2019 1521   RBC 3.53 (L) 06/01/2019 0530   HGB 12.5 11/13/2019 1521   HCT 36.6 11/13/2019 1521   PLT 258 11/13/2019 1521   MCV 84 11/13/2019 1521   MCH 28.7 11/13/2019 1521   MCH 29.7 06/01/2019 0530   MCHC 34.2 11/13/2019 1521   MCHC 32.9 06/01/2019 0530   RDW 11.8 11/13/2019 1521   LYMPHSABS 1.4 11/13/2019 1521   EOSABS 0.1 11/13/2019 1521   BASOSABS 0.0 11/13/2019 1521   CMP     Component Value Date/Time   NA 141 11/13/2019 1521   K 4.0 11/13/2019 1521   CL 104 11/13/2019 1521   CO2 23 11/13/2019 1521   GLUCOSE 80 11/13/2019 1521   BUN 8 11/13/2019 1521   CREATININE 0.73 11/13/2019 1521   CALCIUM 9.3 11/13/2019 1521   PROT 6.8 11/13/2019 1521   ALBUMIN 4.4 11/13/2019 1521   AST 15 11/13/2019 1521   ALT 13 11/13/2019 1521   ALKPHOS 95 11/13/2019 1521   BILITOT 0.4 11/13/2019 1521   GFRNONAA 112 11/13/2019 1521   GFRAA 130 11/13/2019 1521    Imaging Studies: No results found.  Assessment and  Plan:   [name] is a 30 y.o. y/o female has been referred for abdominal bloating and the patient currently 7 months pregnant  Will obtain H. pylori serology  Carafate is helping her.  She takes it once a day  Given that patient states she has to take multiple antacids during the day to abate her heartburn symptoms, literature guidance is to start H2 RA therapy in this setting given that patient is needing over-the-counter antacids multiple times a day instead of intermittently or occasionally  Will prescribe hydrocortisone topical treatment for external hemorrhoids  High-fiber diet encouraged  Metamucil daily to help prevent straining with bowel movements and bulk stool when she is having loose  bowel movements  No alarm symptoms present to indicate endoscopy at this time  If symptoms do not improve patient advised to let us know and she verbalized understanding  Follow-up in clinic to reassess symptoms in upcoming weeks  Dr Vonda Antigua  Speech recognition software was used to dictate the above note.

## 2020-08-12 LAB — H PYLORI, IGM, IGG, IGA AB
H pylori, IgM Abs: <9 units (ref 0.0–8.9)
H. pylori, IgA Abs: <9 units (ref 0.0–8.9)
H. pylori, IgG AbS: 0.25 Index Value (ref 0.00–0.79)

## 2020-08-24 DIAGNOSIS — Z20822 Contact with and (suspected) exposure to covid-19: Secondary | ICD-10-CM | POA: Diagnosis not present

## 2020-08-24 DIAGNOSIS — Z03818 Encounter for observation for suspected exposure to other biological agents ruled out: Secondary | ICD-10-CM | POA: Diagnosis not present

## 2020-09-06 DIAGNOSIS — Z419 Encounter for procedure for purposes other than remedying health state, unspecified: Secondary | ICD-10-CM | POA: Diagnosis not present

## 2020-09-06 DIAGNOSIS — F410 Panic disorder [episodic paroxysmal anxiety] without agoraphobia: Secondary | ICD-10-CM | POA: Diagnosis not present

## 2020-10-03 ENCOUNTER — Telehealth: Payer: Self-pay

## 2020-10-03 MED ORDER — ESCITALOPRAM OXALATE 20 MG PO TABS: 20.0000 mg | ORAL_TABLET | Freq: Every day | ORAL | 3 refills | Status: DC

## 2020-10-03 NOTE — Telephone Encounter (Signed)
Please review.  It looks like you changed her to sertraline 50mg  04/2020.  Pt states it did not help her so her ob/gyn placed her back on escitalopram 20mg .    Thanks,   -05/2020

## 2020-10-03 NOTE — Telephone Encounter (Signed)
refilled 

## 2020-10-03 NOTE — Telephone Encounter (Signed)
[facility] faxed refill request for the following medications:   escitalopram (LEXAPRO) 20 MG tablet   Please advise.  

## 2020-10-04 DIAGNOSIS — Z419 Encounter for procedure for purposes other than remedying health state, unspecified: Secondary | ICD-10-CM | POA: Diagnosis not present

## 2020-10-04 DIAGNOSIS — F410 Panic disorder [episodic paroxysmal anxiety] without agoraphobia: Secondary | ICD-10-CM | POA: Diagnosis not present

## 2020-10-06 DIAGNOSIS — Z113 Encounter for screening for infections with a predominantly sexual mode of transmission: Secondary | ICD-10-CM | POA: Diagnosis not present

## 2020-10-06 DIAGNOSIS — O99343 Other mental disorders complicating pregnancy, third trimester: Secondary | ICD-10-CM | POA: Diagnosis not present

## 2020-10-06 DIAGNOSIS — Z3685 Encounter for antenatal screening for Streptococcus B: Secondary | ICD-10-CM | POA: Diagnosis not present

## 2020-10-06 DIAGNOSIS — Z3A36 36 weeks gestation of pregnancy: Secondary | ICD-10-CM | POA: Diagnosis not present

## 2020-10-06 DIAGNOSIS — O99283 Endocrine, nutritional and metabolic diseases complicating pregnancy, third trimester: Secondary | ICD-10-CM | POA: Diagnosis not present

## 2020-10-06 LAB — OB RESULTS CONSOLE GC/CHLAMYDIA
Chlamydia: NEGATIVE
Gonorrhea: NEGATIVE

## 2020-10-06 LAB — OB RESULTS CONSOLE GBS: GBS: NEGATIVE

## 2020-10-26 ENCOUNTER — Telehealth (HOSPITAL_COMMUNITY): Payer: Self-pay | Admitting: *Deleted

## 2020-10-26 ENCOUNTER — Encounter (HOSPITAL_COMMUNITY): Payer: Self-pay | Admitting: *Deleted

## 2020-10-26 NOTE — Telephone Encounter (Signed)
Preadmission screen  

## 2020-10-31 ENCOUNTER — Other Ambulatory Visit (HOSPITAL_COMMUNITY)
Admission: RE | Admit: 2020-10-31 | Discharge: 2020-10-31 | Disposition: A | Discharge status: Home / Self Care | Payer: Medicaid Other | Source: Ambulatory Visit | Attending: Obstetrics and Gynecology | Admitting: Obstetrics and Gynecology

## 2020-10-31 DIAGNOSIS — Z20822 Contact with and (suspected) exposure to covid-19: Secondary | ICD-10-CM | POA: Insufficient documentation

## 2020-10-31 DIAGNOSIS — Z01812 Encounter for preprocedural laboratory examination: Secondary | ICD-10-CM | POA: Insufficient documentation

## 2020-10-31 LAB — SARS CORONAVIRUS 2 (TAT 6-24 HRS): SARS Coronavirus 2: NEGATIVE

## 2020-11-02 ENCOUNTER — Inpatient Hospital Stay (HOSPITAL_COMMUNITY)
Admission: AD | Admit: 2020-11-02 | Discharge: 2020-11-04 | DRG: 807 | Disposition: A | Discharge status: Home / Self Care | Payer: Medicaid Other | Attending: Obstetrics and Gynecology | Admitting: Obstetrics and Gynecology

## 2020-11-02 ENCOUNTER — Encounter (HOSPITAL_COMMUNITY): Payer: Self-pay | Admitting: Obstetrics and Gynecology

## 2020-11-02 ENCOUNTER — Other Ambulatory Visit: Payer: Self-pay

## 2020-11-02 ENCOUNTER — Inpatient Hospital Stay (HOSPITAL_COMMUNITY): Payer: Medicaid Other | Admitting: Anesthesiology

## 2020-11-02 ENCOUNTER — Inpatient Hospital Stay (HOSPITAL_COMMUNITY): Payer: Medicaid Other

## 2020-11-02 DIAGNOSIS — Z3A40 40 weeks gestation of pregnancy: Secondary | ICD-10-CM

## 2020-11-02 DIAGNOSIS — Z87891 Personal history of nicotine dependence: Secondary | ICD-10-CM | POA: Diagnosis not present

## 2020-11-02 DIAGNOSIS — Z8616 Personal history of COVID-19: Secondary | ICD-10-CM

## 2020-11-02 DIAGNOSIS — O9962 Diseases of the digestive system complicating childbirth: Secondary | ICD-10-CM | POA: Diagnosis not present

## 2020-11-02 DIAGNOSIS — Z3490 Encounter for supervision of normal pregnancy, unspecified, unspecified trimester: Secondary | ICD-10-CM

## 2020-11-02 DIAGNOSIS — F419 Anxiety disorder, unspecified: Secondary | ICD-10-CM | POA: Diagnosis not present

## 2020-11-02 DIAGNOSIS — O99344 Other mental disorders complicating childbirth: Secondary | ICD-10-CM | POA: Diagnosis present

## 2020-11-02 DIAGNOSIS — K219 Gastro-esophageal reflux disease without esophagitis: Secondary | ICD-10-CM | POA: Diagnosis not present

## 2020-11-02 DIAGNOSIS — F32A Depression, unspecified: Secondary | ICD-10-CM | POA: Diagnosis present

## 2020-11-02 DIAGNOSIS — Z141 Cystic fibrosis carrier: Secondary | ICD-10-CM

## 2020-11-02 DIAGNOSIS — O26893 Other specified pregnancy related conditions, third trimester: Secondary | ICD-10-CM | POA: Diagnosis not present

## 2020-11-02 DIAGNOSIS — E039 Hypothyroidism, unspecified: Secondary | ICD-10-CM | POA: Diagnosis present

## 2020-11-02 DIAGNOSIS — Z419 Encounter for procedure for purposes other than remedying health state, unspecified: Secondary | ICD-10-CM | POA: Diagnosis not present

## 2020-11-02 DIAGNOSIS — O99284 Endocrine, nutritional and metabolic diseases complicating childbirth: Secondary | ICD-10-CM | POA: Diagnosis present

## 2020-11-02 HISTORY — DX: Thyrotoxicosis, unspecified without thyrotoxic crisis or storm: E05.90

## 2020-11-02 LAB — CBC
HCT: 35.7 % — ABNORMAL LOW (ref 36.0–46.0)
Hemoglobin: 12 g/dL (ref 12.0–15.0)
MCH: 28.9 pg (ref 26.0–34.0)
MCHC: 33.6 g/dL (ref 30.0–36.0)
MCV: 86 fL (ref 80.0–100.0)
Platelets: 264 10*3/uL (ref 150–400)
RBC: 4.15 MIL/uL (ref 3.87–5.11)
RDW: 13.7 % (ref 11.5–15.5)
WBC: 9.3 10*3/uL (ref 4.0–10.5)
nRBC: 0 % (ref 0.0–0.2)

## 2020-11-02 LAB — TYPE AND SCREEN
ABO/RH(D): O POS
Antibody Screen: NEGATIVE

## 2020-11-02 MED ORDER — LIDOCAINE HCL (PF) 1 % IJ SOLN
INTRAMUSCULAR | Status: DC | PRN
  Administered 2020-11-02: 10 mL via EPIDURAL
  Administered 2020-11-02: 2 mL via EPIDURAL

## 2020-11-02 MED ORDER — OXYCODONE-ACETAMINOPHEN 5-325 MG PO TABS: 1.0000 | ORAL_TABLET | ORAL | Status: DC | PRN

## 2020-11-02 MED ORDER — OXYCODONE-ACETAMINOPHEN 5-325 MG PO TABS: 2.0000 | ORAL_TABLET | ORAL | Status: DC | PRN

## 2020-11-02 MED ORDER — EPHEDRINE 5 MG/ML INJ: 10.0000 mg | INTRAVENOUS | Status: DC | PRN

## 2020-11-02 MED ORDER — SIMETHICONE 80 MG PO CHEW: 80.0000 mg | CHEWABLE_TABLET | ORAL | Status: DC | PRN

## 2020-11-02 MED ORDER — DIPHENHYDRAMINE HCL 50 MG/ML IJ SOLN
12.5000 mg | INTRAMUSCULAR | Status: DC | PRN
  Administered 2020-11-02: 12.5 mg via INTRAVENOUS
  Filled 2020-11-02: qty 1

## 2020-11-02 MED ORDER — LIDOCAINE HCL (PF) 1 % IJ SOLN: 30.0000 mL | INTRAMUSCULAR | Status: DC | PRN

## 2020-11-02 MED ORDER — ZOLPIDEM TARTRATE 5 MG PO TABS: 5.0000 mg | ORAL_TABLET | Freq: Every evening | ORAL | Status: DC | PRN

## 2020-11-02 MED ORDER — ESCITALOPRAM OXALATE 10 MG PO TABS
20.0000 mg | ORAL_TABLET | Freq: Every day | ORAL | Status: DC
  Administered 2020-11-03: 20 mg via ORAL
  Filled 2020-11-02 (×2): qty 2

## 2020-11-02 MED ORDER — SOD CITRATE-CITRIC ACID 500-334 MG/5ML PO SOLN: 30.0000 mL | ORAL | Status: DC | PRN

## 2020-11-02 MED ORDER — COCONUT OIL OIL
1.0000 "application " | TOPICAL_OIL | Status: DC | PRN
  Administered 2020-11-03: 1 via TOPICAL

## 2020-11-02 MED ORDER — ACETAMINOPHEN 325 MG PO TABS
650.0000 mg | ORAL_TABLET | ORAL | Status: DC | PRN
  Administered 2020-11-03: 650 mg via ORAL
  Filled 2020-11-02: qty 2

## 2020-11-02 MED ORDER — FENTANYL-BUPIVACAINE-NACL 0.5-0.125-0.9 MG/250ML-% EP SOLN
12.0000 mL/h | EPIDURAL | Status: DC | PRN
  Filled 2020-11-02: qty 250

## 2020-11-02 MED ORDER — FAMOTIDINE 20 MG PO TABS
20.0000 mg | ORAL_TABLET | Freq: Every day | ORAL | Status: DC
  Administered 2020-11-03: 20 mg via ORAL
  Filled 2020-11-02 (×2): qty 1

## 2020-11-02 MED ORDER — ACETAMINOPHEN 325 MG PO TABS: 650.0000 mg | ORAL_TABLET | ORAL | Status: DC | PRN

## 2020-11-02 MED ORDER — PRENATAL MULTIVITAMIN CH
1.0000 | ORAL_TABLET | Freq: Every day | ORAL | Status: DC
  Administered 2020-11-03: 1 via ORAL
  Filled 2020-11-02: qty 1

## 2020-11-02 MED ORDER — BUTORPHANOL TARTRATE 1 MG/ML IJ SOLN
1.0000 mg | INTRAMUSCULAR | Status: DC | PRN
  Administered 2020-11-02: 1 mg via INTRAVENOUS
  Filled 2020-11-02: qty 1

## 2020-11-02 MED ORDER — PHENYLEPHRINE 40 MCG/ML (10ML) SYRINGE FOR IV PUSH (FOR BLOOD PRESSURE SUPPORT): 80.0000 ug | PREFILLED_SYRINGE | INTRAVENOUS | Status: DC | PRN

## 2020-11-02 MED ORDER — BENZOCAINE-MENTHOL 20-0.5 % EX AERO
1.0000 "application " | INHALATION_SPRAY | CUTANEOUS | Status: DC | PRN
  Administered 2020-11-02 – 2020-11-04 (×2): 1 via TOPICAL
  Filled 2020-11-02 (×3): qty 56

## 2020-11-02 MED ORDER — LACTATED RINGERS IV SOLN: 500.0000 mL | Freq: Once | INTRAVENOUS | Status: DC

## 2020-11-02 MED ORDER — DIBUCAINE (PERIANAL) 1 % EX OINT: 1.0000 "application " | TOPICAL_OINTMENT | CUTANEOUS | Status: DC | PRN

## 2020-11-02 MED ORDER — SENNOSIDES-DOCUSATE SODIUM 8.6-50 MG PO TABS
2.0000 | ORAL_TABLET | ORAL | Status: DC
  Administered 2020-11-02 – 2020-11-03 (×2): 2 via ORAL
  Filled 2020-11-02 (×2): qty 2

## 2020-11-02 MED ORDER — IBUPROFEN 600 MG PO TABS
600.0000 mg | ORAL_TABLET | Freq: Four times a day (QID) | ORAL | Status: DC
  Administered 2020-11-02 – 2020-11-04 (×8): 600 mg via ORAL
  Filled 2020-11-02 (×9): qty 1

## 2020-11-02 MED ORDER — ONDANSETRON HCL 4 MG PO TABS: 4.0000 mg | ORAL_TABLET | ORAL | Status: DC | PRN

## 2020-11-02 MED ORDER — FENTANYL-BUPIVACAINE-NACL 0.5-0.125-0.9 MG/250ML-% EP SOLN
EPIDURAL | Status: DC | PRN
  Administered 2020-11-02: 12 mL/h via EPIDURAL

## 2020-11-02 MED ORDER — OXYTOCIN BOLUS FROM INFUSION
333.0000 mL | Freq: Once | INTRAVENOUS | Status: AC
  Administered 2020-11-02: 333 mL via INTRAVENOUS

## 2020-11-02 MED ORDER — OXYTOCIN-SODIUM CHLORIDE 30-0.9 UT/500ML-% IV SOLN
2.5000 [IU]/h | INTRAVENOUS | Status: DC
  Administered 2020-11-02: 2.5 [IU]/h via INTRAVENOUS

## 2020-11-02 MED ORDER — WITCH HAZEL-GLYCERIN EX PADS: 1.0000 "application " | MEDICATED_PAD | CUTANEOUS | Status: DC | PRN

## 2020-11-02 MED ORDER — ONDANSETRON HCL 4 MG/2ML IJ SOLN: 4.0000 mg | INTRAMUSCULAR | Status: DC | PRN

## 2020-11-02 MED ORDER — TERBUTALINE SULFATE 1 MG/ML IJ SOLN: 0.2500 mg | Freq: Once | INTRAMUSCULAR | Status: DC | PRN

## 2020-11-02 MED ORDER — ONDANSETRON HCL 4 MG/2ML IJ SOLN: 4.0000 mg | Freq: Four times a day (QID) | INTRAMUSCULAR | Status: DC | PRN

## 2020-11-02 MED ORDER — LACTATED RINGERS IV SOLN: INTRAVENOUS | Status: DC

## 2020-11-02 MED ORDER — OXYTOCIN-SODIUM CHLORIDE 30-0.9 UT/500ML-% IV SOLN
1.0000 m[IU]/min | INTRAVENOUS | Status: DC
  Administered 2020-11-02: 2 m[IU]/min via INTRAVENOUS
  Filled 2020-11-02: qty 500

## 2020-11-02 MED ORDER — TETANUS-DIPHTH-ACELL PERTUSSIS 5-2.5-18.5 LF-MCG/0.5 IM SUSY: 0.5000 mL | PREFILLED_SYRINGE | Freq: Once | INTRAMUSCULAR | Status: DC

## 2020-11-02 MED ORDER — LACTATED RINGERS IV SOLN: 500.0000 mL | INTRAVENOUS | Status: DC | PRN

## 2020-11-02 MED ORDER — DIPHENHYDRAMINE HCL 25 MG PO CAPS: 25.0000 mg | ORAL_CAPSULE | Freq: Four times a day (QID) | ORAL | Status: DC | PRN

## 2020-11-02 NOTE — H&P (Signed)
Nicole Dyer is a 30 y.o. female G2P1001 at 50 2/7 weeks (EDD 10/31/20 by 12 week [place] and unsure LMP) presenting for IOL at term.  Pt began having contractions q 10 minutes overnight and is likely in latent labor.  Prenatal care significant for:  1) Chlamydia trachomatis infection in pregnancy        Treated and TOC negative x 2 (last screen 10/06/20)  2) Gastroesophageal reflux disease   on sucralfate and famotidine   3) Hypothyroidism in pregnancy   Baseline labs WNL 9/21 and at 28 weeks WNL   4) Pregnancy with mental disorders   has therapist q 2wk, Lexapro 10 mg   5) COVID-19 08/29/20 mild course  6) CF carrier   FOB tested and negative   OB History    Gravida  2   Para  1   Term  1   Preterm      AB      Living  1     SAB      IAB      Ectopic      Multiple  0   Live Births  1         05-31-2019, 39.5 wks M, 7lbs 5oz, Vaginal Delivery [place]  Past Medical History:  Diagnosis Date  . Anxiety   . Depression   . Hyperthyroidism   . Hypothyroidism    history resolved with first pregnancy  . PCOS (polycystic ovarian syndrome)    Past Surgical History:  Procedure Laterality Date  . ANKLE SURGERY Left    Family History: family history includes Cancer in her father and mother; Hypertension in her father. Social History: former smoker quit 12/19                         Denies current drug or alcohol use in   pregnancy     Maternal Diabetes: No Genetic Screening: Normal Maternal Ultrasounds/Referrals: Normal Fetal Ultrasounds or other Referrals:  None Maternal Substance Abuse:  No Significant Maternal Medications:  Meds include: Syntroid Significant Maternal Lab Results:  Group B Strep negative Other Comments:  None  Review of Systems  Constitutional: Negative for fever.  Gastrointestinal: Positive for abdominal pain.   Maternal Medical History:  Reason for admission: Contractions.   Contractions: Frequency: regular.   Perceived  severity is mild.    Fetal activity: Perceived fetal activity is normal.      Dilation: 2 Effacement (%): 80 Station: -2,-1 Exam by:: S Moyer RN Blood pressure 128/70, pulse 69, temperature 98.4 F (36.9 C), temperature source Oral, resp. rate 18, height 5\' 10"  (1.778 m), weight 108.9 kg, last menstrual period 01/25/2020, currently breastfeeding. Maternal Exam:  Uterine Assessment: Contraction strength is mild.  Contraction frequency is regular.   Abdomen: Patient reports no abdominal tenderness. Fetal presentation: vertex  Introitus: Normal vulva. Normal vagina.  Pelvis: adequate for delivery.      Physical Exam Cardiovascular:     Rate and Rhythm: Normal rate and regular rhythm.  Abdominal:     Palpations: Abdomen is soft.  Genitourinary:    General: Normal vulva.  Neurological:     Mental Status: She is alert.  Psychiatric:        Mood and Affect: Mood normal.     Prenatal labs: ABO, Rh: --/--/O POS (03/30 10-02-1995) Antibody: NEG (03/30 0829) Rubella: Immune, Immune (09/28 0000) RPR: Nonreactive, Nonreactive (09/28 0000)  HBsAg: Negative, Negative (09/28 0000)  HIV: Non-reactive, Non-reactive (09/28 0000)  GBS: Negative/-- (03/03 0000)  NIPT low risk  One hour GCT 99  Assessment/Plan: Pt admitted probably in latent labor for IOL and already getting uncomfortable.  Will allow epidural and AROM once comfortable.   Oliver Pila 11/02/2020, 10:05 AM
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
## 2020-11-02 NOTE — Progress Notes (Signed)
Patient ID: Nicole Dyer, female   DOB: 1990-09-15, 30 y.o.   MRN: [id number] Micah Flesher to see pt in the NICU visiting baby.  Baby doing much better and able to start weaning 02. ECHO WNL Transient pulmonary hypertension improving, so if stable will wean off O2.  Baby active and alert.

## 2020-11-02 NOTE — Anesthesia Procedure Notes (Signed)
Epidural Patient location during procedure: OB Start time: 11/02/2020 10:25 AM End time: 11/02/2020 10:33 AM  Staffing Anesthesiologist: Lannie Fields, DO Performed: anesthesiologist   Preanesthetic Checklist Completed: patient identified, IV checked, risks and benefits discussed, monitors and equipment checked, pre-op evaluation and timeout performed  Epidural Patient position: sitting Prep: DuraPrep and site prepped and draped Patient monitoring: continuous pulse ox, blood pressure, heart rate and cardiac monitor Approach: midline Location: L3-L4 Injection technique: LOR air  Needle:  Needle type: Tuohy  Needle gauge: 17 G Needle length: 9 cm Needle insertion depth: 6 cm Catheter type: closed end flexible Catheter size: 19 Gauge Catheter at skin depth: 11 cm Test dose: negative  Assessment Sensory level: T8 Events: blood not aspirated, injection not painful, no injection resistance, no paresthesia and negative IV test  Additional Notes Patient identified. Risks/Benefits/Options discussed with patient including but not limited to bleeding, infection, nerve damage, paralysis, failed block, incomplete pain control, headache, blood pressure changes, nausea, vomiting, reactions to medication both or allergic, itching and postpartum back pain. Confirmed with bedside nurse the patient's most recent platelet count. Confirmed with patient that they are not currently taking any anticoagulation, have any bleeding history or any family history of bleeding disorders. Patient expressed understanding and wished to proceed. All questions were answered. Sterile technique was used throughout the entire procedure. Please see nursing notes for vital signs. Test dose was given through epidural catheter and negative prior to continuing to dose epidural or start infusion. Warning signs of high block given to the patient including shortness of breath, tingling/numbness in hands, complete motor  block, or any concerning symptoms with instructions to call for help. Patient was given instructions on fall risk and not to get out of bed. All questions and concerns addressed with instructions to call with any issues or inadequate analgesia.  Reason for block:procedure for pain

## 2020-11-02 NOTE — Lactation Note (Signed)
This note was copied from a baby's chart. Lactation Consultation Note  Patient Name: Nicole Dyer [id number] Date: 11/02/2020 Reason for consult: L&D Initial assessment;Term;NICU baby;Maternal endocrine disorder Age:30 hours  Mom is a P2 who nursed (mixed fed) her 1st child for 6 months. Infant went to the NICU soon after birth. I let Mom know that we would have her start pumping once she arrived in her postpartum room. Mom appropriately tearful; additional education will be done later. Mom's current questions answered.   Based on Mom's nipple diameter at rest, size 27 flanges will be appropriate for her to start pumping.   [name] 11/02/2020, 2:50 PM

## 2020-11-03 LAB — CBC
HCT: 33.8 % — ABNORMAL LOW (ref 36.0–46.0)
Hemoglobin: 11.2 g/dL — ABNORMAL LOW (ref 12.0–15.0)
MCH: 29.3 pg (ref 26.0–34.0)
MCHC: 33.1 g/dL (ref 30.0–36.0)
MCV: 88.5 fL (ref 80.0–100.0)
Platelets: 222 10*3/uL (ref 150–400)
RBC: 3.82 MIL/uL — ABNORMAL LOW (ref 3.87–5.11)
RDW: 13.9 % (ref 11.5–15.5)
WBC: 8 10*3/uL (ref 4.0–10.5)
nRBC: 0 % (ref 0.0–0.2)

## 2020-11-03 LAB — RPR: RPR Ser Ql: NONREACTIVE

## 2020-11-03 NOTE — Progress Notes (Signed)
Patient ID: Nicole Dyer, female   DOB: 30-Nov-1990, 30 y.o.   MRN: [id number] Pt not in room - visiting in NICU.  Will return to round on her

## 2020-11-03 NOTE — Progress Notes (Signed)
Post Partum Day 1 Subjective: up ad lib, voiding, tolerating PO, + flatus and lochia mild. Feels sore as expected but tolerable. Baby doing well on 4L [place] in NICU. She is coping well. Denies HA, CP or SOB  Objective: Blood pressure 118/81, pulse 70, temperature 98.1 F (36.7 C), temperature source Oral, resp. rate 16, height 5\' 10"  (1.778 m), weight 108.9 kg, last menstrual period 01/25/2020, SpO2 100 %, unknown if currently breastfeeding.  Physical Exam:  General: alert, cooperative and no distress Lochia: appropriate Uterine Fundus: firm Incision: n/a DVT Evaluation: No evidence of DVT seen on physical exam.  Recent Labs    11/02/20 0829 11/03/20 0445  HGB 12.0 11.2*  HCT 35.7* 33.8*    Assessment/Plan: Plan for discharge tomorrow and Circumcision prior to discharge   LOS: 1 day   Nicole Dyer W Nicole Dyer 11/03/2020, 10:14 AM

## 2020-11-03 NOTE — Anesthesia Postprocedure Evaluation (Signed)
Anesthesia Post Note  Patient: Nadiyah Zeis  Procedure(s) Performed: AN AD HOC LABOR EPIDURAL     Patient location during evaluation: OB High Risk Anesthesia Type: Epidural Level of consciousness: awake, awake and alert and oriented Pain management: pain level controlled Vital Signs Assessment: post-procedure vital signs reviewed and stable Respiratory status: spontaneous breathing and respiratory function stable Cardiovascular status: blood pressure returned to baseline Postop Assessment: no headache, epidural receding, patient able to bend at knees, adequate PO intake, no backache, no apparent nausea or vomiting and able to ambulate Anesthetic complications: no   No complications documented.  Last Vitals:  Vitals:   11/03/20 0349 11/03/20 0728  BP: 120/66 118/81  Pulse: 72 70  Resp: 16 16  Temp: 36.7 C 36.7 C  SpO2: 98% 100%    Last Pain:  Vitals:   11/03/20 0935  TempSrc:   PainSc: 2    Pain Goal: Patients Stated Pain Goal: 0 (11/03/20 0935)                 Cleda Clarks

## 2020-11-03 NOTE — Lactation Note (Signed)
This note was copied from a baby's chart. Lactation Consultation Note Baby 17 hrs old in NICU. Mom has DEBP set up in rm. Mom is pumping. She got more out the 1st time. Explained that is normal. Experienced BF mom states she is doing good. Mom has DEBP at home. Medela. Gave mom extra bottles for milk to go to NICU. Milk storage for NICU baby discussed. NICU booklet and Lactation brochure given. Encouraged to call for questions or concerns.  Patient Name: Nicole Aleda Dyer [id number] Date: 11/03/2020 Reason for consult: Initial assessment;NICU baby Age:30 hours  Maternal Data    Feeding    LATCH Score                    Lactation Tools Discussed/Used Tools: Pump Breast pump type: Double-Electric Breast Pump Pump Education: Milk Storage Reason for Pumping: NICU Pumping frequency: Q3 hrs  Interventions    Discharge WIC Program: Yes  Consult Status Consult Status: Follow-up Date: 11/04/20 Follow-up type: In-patient    Charyl Dancer 11/03/2020, 6:33 AM

## 2020-11-04 DIAGNOSIS — Z419 Encounter for procedure for purposes other than remedying health state, unspecified: Secondary | ICD-10-CM | POA: Diagnosis not present

## 2020-11-04 MED ORDER — IBUPROFEN 600 MG PO TABS: 600.0000 mg | ORAL_TABLET | Freq: Four times a day (QID) | ORAL | 0 refills | Status: AC
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
## 2020-11-04 NOTE — Discharge Instructions (Signed)
As per discharge pamphlet °

## 2020-11-04 NOTE — Progress Notes (Signed)
Pt given discharge instructions and pt verbalized understanding. All questions answered.  

## 2020-11-04 NOTE — Discharge Summary (Signed)
Postpartum Discharge Summary      Patient Name: Nicole Dyer DOB: 04/03/1991 MRN: [id number]  Date of admission: 11/02/2020 Delivery date:11/02/2020  Delivering provider: Huel Cote  Date of discharge: 11/04/2020  Admitting diagnosis: Pregnancy [Z34.90] NSVD (normal spontaneous vaginal delivery) [O80] Intrauterine pregnancy: [redacted]w[redacted]d     Secondary diagnosis:  Active Problems:   Pregnancy   NSVD (normal spontaneous vaginal delivery)      Discharge diagnosis: Term Pregnancy Delivered                                              Hospital course: Induction of Labor With Vaginal Delivery   30 y.o. yo G2P2002 at [redacted]w[redacted]d was admitted to the hospital 11/02/2020 for induction of labor.  Indication for induction: Favorable cervix at term.  Patient had an uncomplicated labor course as follows: Membrane Rupture Time/Date: 1:05 PM ,11/02/2020   Delivery Method:Vaginal, Spontaneous  Episiotomy: None  Lacerations:  Periurethral  Details of delivery can be found in separate delivery note.  Patient had a routine postpartum course. Patient is discharged home 11/04/20.  Newborn Data: Birth date:11/02/2020  Birth time:1:10 PM  Gender:Female  Living status:Living  Apgars:8 ,9  Weight:3440 g   Physical exam  Vitals:   11/03/20 1658 11/03/20 2148 11/04/20 0424 11/04/20 0732  BP: 113/68 122/73 102/61 102/72  Pulse: 73 75 69 76  Resp: 18 18 16 18   Temp: 98.1 F (36.7 C) 98.5 F (36.9 C) 98 F (36.7 C) 98.1 F (36.7 C)  TempSrc: Oral Oral Oral Oral  SpO2: 100% 100% 100% 100%  Weight:      Height:       General: alert Lochia: appropriate Uterine Fundus: firm  Labs: Lab Results  Component Value Date   WBC 8.0 11/03/2020   HGB 11.2 (L) 11/03/2020   HCT 33.8 (L) 11/03/2020   MCV 88.5 11/03/2020   PLT 222 11/03/2020   CMP Latest Ref Rng & Units 11/13/2019  Glucose 65 - 99 mg/dL 80  BUN 6 - 20 mg/dL 8  Creatinine 01/13/2020 - 7.82 mg/dL 9.56  Sodium 2.13 - 086 mmol/L 141  Potassium  3.5 - 5.2 mmol/L 4.0  Chloride 96 - 106 mmol/L 104  CO2 20 - 29 mmol/L 23  Calcium 8.7 - 10.2 mg/dL 9.3  Total Protein 6.0 - 8.5 g/dL 6.8  Total Bilirubin 0.0 - 1.2 mg/dL 0.4  Alkaline Phos 39 - 117 IU/L 95  AST 0 - 40 IU/L 15  ALT 0 - 32 IU/L 13   Edinburgh Score: Edinburgh Postnatal Depression Scale Screening Tool 05/31/2019  I have been able to laugh and see the funny side of things. 0  I have looked forward with enjoyment to things. 1  I have blamed myself unnecessarily when things went wrong. 2  I have been anxious or worried for no good reason. 3  I have felt scared or panicky for no good reason. 2  Things have been getting on top of me. 2  I have been so unhappy that I have had difficulty sleeping. 1  I have felt sad or miserable. 1  I have been so unhappy that I have been crying. 1  The thought of harming myself has occurred to me. 0  Edinburgh Postnatal Depression Scale Total 13      After visit meds:  Allergies as of 11/04/2020  Reactions   Poison Ivy Extract Swelling, Rash   Bee Venom Swelling      Medication List    TAKE these medications   acetaminophen 500 MG tablet Commonly known as: TYLENOL Take 500 mg by mouth every 6 (six) hours as needed for mild pain or headache.   escitalopram 20 MG tablet Commonly known as: LEXAPRO Take 1 tablet (20 mg total) by mouth daily. What changed: when to take this   famotidine 20 MG tablet Commonly known as: Pepcid Take 1 tablet (20 mg total) by mouth daily. What changed: when to take this   hydrocortisone 1 % ointment Apply 1 application topically at bedtime. What changed:   when to take this  reasons to take this   ibuprofen 600 MG tablet Commonly known as: ADVIL Take 1 tablet (600 mg total) by mouth every 6 (six) hours.   prenatal multivitamin Tabs tablet Take 1 tablet by mouth at bedtime.   psyllium 58.6 % powder Commonly known as: METAMUCIL Take 1 packet by mouth daily.   sucralfate 1 g  tablet Commonly known as: CARAFATE Take 1 tablet by mouth 4 (four) times daily as needed (for heartburn).        Discharge home in stable condition Infant Feeding: Breast Infant Disposition:NICU Discharge instruction: per After Visit Summary and Postpartum booklet. Activity: Advance as tolerated. Pelvic rest for 6 weeks.  Diet: routine diet  Postpartum Appointment:6 weeks  Future Appointments: Future Appointments  Date Time Provider Department Center  11/17/2020  3:00 PM Pasty Spillers, MD [facility] None   Follow up Visit:  Follow-up Information    Huel Cote, MD. Schedule an appointment as soon as possible for a visit in 6 week(s).   Specialty: Obstetrics and Gynecology Contact information: [address] [place] [address] [phone]                   11/04/2020 Zenaida Niece, MD

## 2020-11-04 NOTE — Progress Notes (Signed)
PPD #2 Doing well, baby stable in NICU Afeb, VSS D/c home

## 2020-11-07 ENCOUNTER — Telehealth: Payer: Self-pay

## 2020-11-07 ENCOUNTER — Ambulatory Visit: Payer: Self-pay

## 2020-11-07 NOTE — Lactation Note (Signed)
This note was copied from a baby's chart. Lactation Consultation Note  Patient Name: Nicole Dyer JJHER'D Date: 11/07/2020 Reason for consult: Follow-up assessment;NICU baby;Term;Maternal endocrine disorder Age:30 years  LC in to visit with P2 Mom of term baby on day 30 of life.  Mom reports having a hard time pumping often enough and attending to her 30 month old at home.    Mom is pumping every 2-3 hrs during the day and 4 hrs at night, volume about 4+ oz per pumping total.  Mom reports baby latches and feeds well, while softening the breast during feedings.  Latch scores of 10 given by NICU RNs.  Mom aware of importance of pumping when baby is fed supplement or she isn't able to breastfeed.   Mom plans to spend the night and breast feed ad lib.   LC to f/u in am prior to possible discharge from NICU.     Lactation Tools Discussed/Used Tools: Pump Breast pump type: Double-Electric Breast Pump Pumping frequency: Q3 hrs Pumped volume: 120 mL  Interventions Interventions: Breast feeding basics reviewed;Skin to skin;DEBP;Education    Consult Status Consult Status: Follow-up Date: 11/08/20 Follow-up type: In-patient    Nicole Dyer 11/07/2020, 3:27 PM

## 2020-11-07 NOTE — Telephone Encounter (Signed)
Transition Care Management Unsuccessful Follow-up Telephone Call  Date of discharge and from where:  11/04/2020 from [facility]  Attempts:  1st Attempt  Reason for unsuccessful TCM follow-up call:  Left voice message

## 2020-11-08 ENCOUNTER — Ambulatory Visit: Payer: Self-pay

## 2020-11-08 NOTE — Telephone Encounter (Signed)
Transition Care Management Follow-up Telephone Call  Date of discharge and from where: 11/04/2020 from [facility]  How have you been since you were released from the hospital? Pt stated that she is feeling well and has no questions or concerns at this time.   Any questions or concerns? No  Items Reviewed:  Did the pt receive and understand the discharge instructions provided? Yes   Medications obtained and verified? Yes   Other? No   Any new allergies since your discharge? No   Dietary orders reviewed? n/a  Do you have support at home? Yes   Functional Questionnaire: (I = Independent and D = Dependent) ADLs: I  Bathing/Dressing- I  Meal Prep- I  Eating- I  Maintaining continence- I  Transferring/Ambulation- I  Managing Meds- I  Follow up appointments reviewed:   PCP Hospital f/u appt confirmed? No    Specialist Hospital f/u appt confirmed? Yes  6 weeks with Dr. Senaida Ores  Are transportation arrangements needed? No   If their condition worsens, is the pt aware to call PCP or go to the Emergency Dept.? Yes  Was the patient provided with contact information for the PCP's office or ED? Yes  Was to pt encouraged to call back with questions or concerns? Yes

## 2020-11-08 NOTE — Lactation Note (Signed)
This note was copied from a baby's chart. Lactation Consultation Note  Patient Name: Nicole Dyer [id number] Date: 11/08/2020 Reason for consult: NICU baby;Follow-up assessment D/c planning Age:30 days Baby to d/c today. Mom stayed last night and bf. She remarked that he cluster fed and she supplemented p bf'ing. We reviewed that he is likely to cluster feed again tonight because of circ today. We also discussed importance of continued lactation support. Baby's ped and Mom's OB in [facility] both offer LC services. Mom chooses to f/u with those providers.   Feeding Mother's Current Feeding Choice: Breast Milk Nipple Type: Slow - flow  LATCH Score: 10  Discharge Discharge Education: Outpatient recommendation  Consult Status Consult Status: Complete Follow-up type: In-patient   Elder Negus, MA IBCLC 11/08/2020, 2:26 PM

## 2020-11-15 DIAGNOSIS — F410 Panic disorder [episodic paroxysmal anxiety] without agoraphobia: Secondary | ICD-10-CM | POA: Diagnosis not present

## 2020-11-17 ENCOUNTER — Ambulatory Visit (INDEPENDENT_AMBULATORY_CARE_PROVIDER_SITE_OTHER): Payer: Medicaid Other | Admitting: Gastroenterology

## 2020-11-17 ENCOUNTER — Encounter: Payer: Self-pay | Admitting: Gastroenterology

## 2020-11-17 ENCOUNTER — Other Ambulatory Visit: Payer: Self-pay

## 2020-11-17 VITALS — BP 103/66 | HR 65 | Temp 98.3°F | Ht 70.0 in | Wt 221.8 lb

## 2020-11-17 DIAGNOSIS — R1013 Epigastric pain: Secondary | ICD-10-CM

## 2020-11-17 MED ORDER — FAMOTIDINE 20 MG PO TABS: 20.0000 mg | ORAL_TABLET | Freq: Two times a day (BID) | ORAL | 0 refills | Status: DC

## 2020-11-17 NOTE — Progress Notes (Signed)
Nicole Bouillon, MD [address]  [address]  [place], [place] [address]  Main: [phone]  Fax: [phone]   Primary Care Physician: Margaretann Loveless, PA-C   Chief Complaint  Patient presents with  . Bloated    HPI: Nicole Dyer is a 30 y.o. female here for follow-up of abdominal bloating.  Patient recently underwent uncomplicated vaginal delivery 2 weeks ago.  Patient reports improvement in abdominal bloating since last visit.  Reporting 1 soft bowel movement daily.  However, continues to have belching, burping and heartburn despite Pepcid once daily, but states her symptoms are better on this medication than without it.  Takes Carafate as needed but does not help.  She was placed on his medications after trial of first-line medications in pregnancy, as she was pregnant and to avoid PPI in pregnancy.  Patient is currently breast-feeding.  No dysphagia, no nausea or vomiting.  Patient was found to have external hemorrhoids on rectal exam on last visit and topical steroids were Prescribed which did help improve symptoms.  She states she used to have large amount of rectal bleeding, however, now is only noting that very small spots on toilet paper, very intermittently, specially since completion of her pregnancy  Current Outpatient Medications  Medication Sig Dispense Refill  . acetaminophen (TYLENOL) 500 MG tablet Take 500 mg by mouth every 6 (six) hours as needed for mild pain or headache.    . escitalopram (LEXAPRO) 20 MG tablet Take 1 tablet (20 mg total) by mouth daily. (Patient taking differently: Take 20 mg by mouth at bedtime.) 90 tablet 3  . famotidine (PEPCID) 20 MG tablet Take 1 tablet (20 mg total) by mouth 2 (two) times daily. 60 tablet 0  . hydrocortisone 1 % ointment Apply 1 application topically at bedtime. (Patient taking differently: Apply 1 application topically daily as needed for itching.) 30 g 0  . ibuprofen (ADVIL) 600 MG tablet Take 1 tablet (600  mg total) by mouth every 6 (six) hours. 30 tablet 0  . Prenatal Vit-Fe Fumarate-FA (PRENATAL MULTIVITAMIN) TABS tablet Take 1 tablet by mouth at bedtime.    . psyllium (METAMUCIL) 58.6 % powder Take 1 packet by mouth daily. 283 g 0  . sucralfate (CARAFATE) 1 g tablet Take 1 tablet by mouth 4 (four) times daily as needed (for heartburn).     No current facility-administered medications for this visit.    Allergies as of 11/17/2020 - Review Complete 11/17/2020  Allergen Reaction Noted  . Poison ivy extract Swelling and Rash 04/01/2019  . Bee venom Swelling 05/04/2019    ROS:  General: Negative for anorexia, weight loss, fever, chills, fatigue, weakness. ENT: Negative for hoarseness, difficulty swallowing , nasal congestion. CV: Negative for chest pain, angina, palpitations, dyspnea on exertion, peripheral edema.  Respiratory: Negative for dyspnea at rest, dyspnea on exertion, cough, sputum, wheezing.  GI: See history of present illness. GU:  Negative for dysuria, hematuria, urinary incontinence, urinary frequency, nocturnal urination.  Endo: Negative for unusual weight change.    Physical Examination:   BP 103/66   Pulse 65   Temp 98.3 F (36.8 C) (Oral)   Ht 5\' 10"  (1.778 m)   Wt 221 lb 12.8 oz (100.6 kg)   LMP 01/25/2020   BMI 31.82 kg/m   General: Well-nourished, well-developed in no acute distress.  Eyes: No icterus. Conjunctivae pink. Mouth: Oropharyngeal mucosa moist and pink , no lesions erythema or exudate. Neck: Supple, Trachea midline Abdomen: Bowel sounds are normal, nontender,  nondistended, no hepatosplenomegaly or masses, no abdominal bruits or hernia , no rebound or guarding.   Extremities: No lower extremity edema. No clubbing or deformities. Neuro: Alert and oriented x 3.  Grossly intact. Skin: Warm and dry, no jaundice.   Psych: Alert and cooperative, normal mood and affect.   Labs: CMP     Component Value Date/Time   NA 141 11/13/2019 1521   K 4.0  11/13/2019 1521   CL 104 11/13/2019 1521   CO2 23 11/13/2019 1521   GLUCOSE 80 11/13/2019 1521   BUN 8 11/13/2019 1521   CREATININE 0.73 11/13/2019 1521   CALCIUM 9.3 11/13/2019 1521   PROT 6.8 11/13/2019 1521   ALBUMIN 4.4 11/13/2019 1521   AST 15 11/13/2019 1521   ALT 13 11/13/2019 1521   ALKPHOS 95 11/13/2019 1521   BILITOT 0.4 11/13/2019 1521   GFRNONAA 112 11/13/2019 1521   GFRAA 130 11/13/2019 1521   Lab Results  Component Value Date   WBC 8.0 11/03/2020   HGB 11.2 (L) 11/03/2020   HCT 33.8 (L) 11/03/2020   MCV 88.5 11/03/2020   PLT 222 11/03/2020    Imaging Studies: No results found.  Assessment and Plan:   Nicole Dyer is a 30 y.o. y/o female here for follow up of abdominal bloating that started during recent pregnancy, this has improved, with ongoing symptoms of belching and burping despite once daily H2RA  As per uptodate and breastfeeding considerations H2RA is preferred to PPI:  "Famotidine is present in breast milk.  The relative infant dose (RID) of famotidine is 2.2% when calculated using the highest breast milk concentration located and compared to an infant therapeutic dose of 0.5 mg/kg/day.  In general, breastfeeding is considered acceptable when the RID of a medication is <10% Dareen Piano 2016; Vanita Panda 2000).  The RID of famotidine was calculated using a milk concentration of 0.072 mcg/mL, providing an estimated daily infant dose via breast milk of 0.011 mg/kg/day. This milk concentration was obtained following maternal administration of a single dose of famotidine 40 mg orally Toni Amend 1988).  According to the manufacturer, the decision to breastfeed during therapy should consider the risk of infant exposure, the benefits of breastfeeding to the infant, and benefits of treatment to the mother. When treatment with a histamine H2 antagonist is needed, famotidine is one of the preferred agents due to its lower concentrations in breast milk Hal Hope  2005)."  Above was discussed in laymens terms with the patient in detail, and she agrees to continue H2 RA therapy given ongoing symptoms.   Will increase H2RA to BID for 30 days and then pt advised to decrease it back to once a day if symptoms continue to improve as she gets further away from recent pregnancy and vaginal delivery and lifestyle modificiations  If symptoms continue consider EGD  Continue High fiber diet and minute blood spots on toilet paper from external hemorrhoid also to likely to improve since she has completed her pregnancy  If continues consider colonoscopy and/or surgery referral for external hemorrhoids  Follow up in 2-3 months and call back if symptoms not improved     Dr Nicole Dyer

## 2020-11-17 NOTE — Patient Instructions (Signed)
High-Fiber Eating Plan Fiber, also called dietary fiber, is a type of carbohydrate. It is found foods such as fruits, vegetables, whole grains, and beans. A high-fiber diet can have many health benefits. Your health care provider may recommend a high-fiber diet to help:  Prevent constipation. Fiber can make your bowel movements more regular.  Lower your cholesterol.  Relieve the following conditions: ? Inflammation of veins in the anus (hemorrhoids). ? Inflammation of specific areas of the digestive tract (uncomplicated diverticulosis). ? A problem of the large intestine, also called the colon, that sometimes causes pain and diarrhea (irritable bowel syndrome, or IBS).  Prevent overeating as part of a weight-loss plan.  Prevent heart disease, type 2 diabetes, and certain cancers. What are tips for following this plan? Reading food labels  Check the nutrition facts label on food products for the amount of dietary fiber. Choose foods that have 5 grams of fiber or more per serving.  The goals for recommended daily fiber intake include: ? Men (age 50 or younger): 34-38 g. ? Men (over age 50): 28-34 g. ? Women (age 50 or younger): 25-28 g. ? Women (over age 50): 22-25 g. Your daily fiber goal is _____________ g.   Shopping  Choose whole fruits and vegetables instead of processed forms, such as apple juice or applesauce.  Choose a wide variety of high-fiber foods such as avocados, lentils, oats, and kidney beans.  Read the nutrition facts label of the foods you choose. Be aware of foods with added fiber. These foods often have high sugar and sodium amounts per serving. Cooking  Use whole-grain flour for baking and cooking.  Cook with brown rice instead of white rice. Meal planning  Start the day with a breakfast that is high in fiber, such as a cereal that contains 5 g of fiber or more per serving.  Eat breads and cereals that are made with whole-grain flour instead of refined  flour or white flour.  Eat brown rice, bulgur wheat, or millet instead of white rice.  Use beans in place of meat in soups, salads, and pasta dishes.  Be sure that half of the grains you eat each day are whole grains. General information  You can get the recommended daily intake of dietary fiber by: ? Eating a variety of fruits, vegetables, grains, nuts, and beans. ? Taking a fiber supplement if you are not able to take in enough fiber in your diet. It is better to get fiber through food than from a supplement.  Gradually increase how much fiber you consume. If you increase your intake of dietary fiber too quickly, you may have bloating, cramping, or gas.  Drink plenty of water to help you digest fiber.  Choose high-fiber snacks, such as berries, raw vegetables, nuts, and popcorn. What foods should I eat? Fruits Berries. Pears. Apples. Oranges. Avocado. Prunes and raisins. Dried figs. Vegetables Sweet potatoes. Spinach. Kale. Artichokes. Cabbage. Broccoli. Cauliflower. Green peas. Carrots. Squash. Grains Whole-grain breads. Multigrain cereal. Oats and oatmeal. Brown rice. Barley. Bulgur wheat. Millet. Quinoa. Bran muffins. Popcorn. Rye wafer crackers. Meats and other proteins Navy beans, kidney beans, and pinto beans. Soybeans. Split peas. Lentils. Nuts and seeds. Dairy Fiber-fortified yogurt. Beverages Fiber-fortified soy milk. Fiber-fortified orange juice. Other foods Fiber bars. The items listed above may not be a complete list of recommended foods and beverages. Contact a dietitian for more information. What foods should I avoid? Fruits Fruit juice. Cooked, strained fruit. Vegetables Fried potatoes. Canned vegetables. Well-cooked vegetables. Grains   White bread. Pasta made with refined flour. White rice. Meats and other proteins Fatty cuts of meat. Fried chicken or fried fish. Dairy Milk. Yogurt. Cream cheese. Sour cream. Fats and oils Butters. Beverages Soft  drinks. Other foods Cakes and pastries. The items listed above may not be a complete list of foods and beverages to avoid. Talk with your dietitian about what choices are best for you. Summary  Fiber is a type of carbohydrate. It is found in foods such as fruits, vegetables, whole grains, and beans.  A high-fiber diet has many benefits. It can help to prevent constipation, lower blood cholesterol, aid weight loss, and reduce your risk of heart disease, diabetes, and certain cancers.  Increase your intake of fiber gradually. Increasing fiber too quickly may cause cramping, bloating, and gas. Drink plenty of water while you increase the amount of fiber you consume.  The best sources of fiber include whole fruits and vegetables, whole grains, nuts, seeds, and beans. This information is not intended to replace advice given to you by your health care provider. Make sure you discuss any questions you have with your health care provider. Document Revised: 11/26/2019 Document Reviewed: 11/26/2019 Elsevier Patient Education  2021 [employer]  

## 2020-11-29 DIAGNOSIS — R200 Anesthesia of skin: Secondary | ICD-10-CM | POA: Diagnosis not present

## 2020-11-29 DIAGNOSIS — M5441 Lumbago with sciatica, right side: Secondary | ICD-10-CM | POA: Diagnosis not present

## 2020-11-29 DIAGNOSIS — M792 Neuralgia and neuritis, unspecified: Secondary | ICD-10-CM | POA: Diagnosis not present

## 2020-11-29 DIAGNOSIS — M5442 Lumbago with sciatica, left side: Secondary | ICD-10-CM | POA: Diagnosis not present

## 2020-11-29 DIAGNOSIS — Z87891 Personal history of nicotine dependence: Secondary | ICD-10-CM | POA: Diagnosis not present

## 2020-11-29 DIAGNOSIS — G8929 Other chronic pain: Secondary | ICD-10-CM | POA: Diagnosis not present

## 2020-12-04 DIAGNOSIS — Z419 Encounter for procedure for purposes other than remedying health state, unspecified: Secondary | ICD-10-CM | POA: Diagnosis not present

## 2020-12-13 DIAGNOSIS — F410 Panic disorder [episodic paroxysmal anxiety] without agoraphobia: Secondary | ICD-10-CM | POA: Diagnosis not present

## 2020-12-26 DIAGNOSIS — M5442 Lumbago with sciatica, left side: Secondary | ICD-10-CM | POA: Diagnosis not present

## 2020-12-26 DIAGNOSIS — M4726 Other spondylosis with radiculopathy, lumbar region: Secondary | ICD-10-CM | POA: Diagnosis not present

## 2020-12-26 DIAGNOSIS — G8929 Other chronic pain: Secondary | ICD-10-CM | POA: Diagnosis not present

## 2020-12-26 DIAGNOSIS — M792 Neuralgia and neuritis, unspecified: Secondary | ICD-10-CM | POA: Diagnosis not present

## 2020-12-26 DIAGNOSIS — M5441 Lumbago with sciatica, right side: Secondary | ICD-10-CM | POA: Diagnosis not present

## 2020-12-29 DIAGNOSIS — Z30431 Encounter for routine checking of intrauterine contraceptive device: Secondary | ICD-10-CM | POA: Diagnosis not present

## 2020-12-29 DIAGNOSIS — Z1389 Encounter for screening for other disorder: Secondary | ICD-10-CM | POA: Diagnosis not present

## 2020-12-29 DIAGNOSIS — Z30430 Encounter for insertion of intrauterine contraceptive device: Secondary | ICD-10-CM | POA: Diagnosis not present

## 2020-12-29 DIAGNOSIS — Z3009 Encounter for other general counseling and advice on contraception: Secondary | ICD-10-CM | POA: Diagnosis not present

## 2021-01-04 DIAGNOSIS — Z419 Encounter for procedure for purposes other than remedying health state, unspecified: Secondary | ICD-10-CM | POA: Diagnosis not present

## 2021-01-09 DIAGNOSIS — F410 Panic disorder [episodic paroxysmal anxiety] without agoraphobia: Secondary | ICD-10-CM | POA: Diagnosis not present

## 2021-01-10 DIAGNOSIS — M25572 Pain in left ankle and joints of left foot: Secondary | ICD-10-CM | POA: Diagnosis not present

## 2021-01-10 DIAGNOSIS — R200 Anesthesia of skin: Secondary | ICD-10-CM | POA: Diagnosis not present

## 2021-01-10 DIAGNOSIS — G8929 Other chronic pain: Secondary | ICD-10-CM | POA: Diagnosis not present

## 2021-01-10 DIAGNOSIS — M792 Neuralgia and neuritis, unspecified: Secondary | ICD-10-CM | POA: Diagnosis not present

## 2021-02-03 DIAGNOSIS — Z419 Encounter for procedure for purposes other than remedying health state, unspecified: Secondary | ICD-10-CM | POA: Diagnosis not present

## 2021-02-07 DIAGNOSIS — F410 Panic disorder [episodic paroxysmal anxiety] without agoraphobia: Secondary | ICD-10-CM | POA: Diagnosis not present

## 2021-02-23 ENCOUNTER — Ambulatory Visit (INDEPENDENT_AMBULATORY_CARE_PROVIDER_SITE_OTHER): Payer: Medicaid Other | Admitting: Gastroenterology

## 2021-02-23 ENCOUNTER — Other Ambulatory Visit: Payer: Self-pay

## 2021-02-23 VITALS — BP 96/70 | HR 80 | Temp 97.7°F | Ht 70.0 in | Wt 215.6 lb

## 2021-02-23 DIAGNOSIS — K649 Unspecified hemorrhoids: Secondary | ICD-10-CM

## 2021-02-23 DIAGNOSIS — R12 Heartburn: Secondary | ICD-10-CM

## 2021-02-23 MED ORDER — FAMOTIDINE 20 MG PO TABS: 20.0000 mg | ORAL_TABLET | Freq: Every day | ORAL | 0 refills | Status: DC

## 2021-02-23 NOTE — Progress Notes (Signed)
Nicole Bouillon, MD [address]  [address]  [place], [place] [address]  Main: [phone]  Fax: [phone]   Primary Care Physician: Margaretann Loveless, PA-C   Chief Complaint  Patient presents with   Follow-up    3 month... pt reports better BM with high fiber diet... but still having discomfort with hemorrhoids   Gastroesophageal Reflux    has improved    HPI: Nicole Dyer is a 30 y.o. female here for follow-up of heartburn.  Patient doing well symptomatically on Pepcid twice daily.  She states as long as she takes her medications, she does not have any further symptoms of heartburn.  No abdominal pain.  Reports 1 formed bowel movement daily with a high-fiber diet.  However, states despite soft stools, she continues to have issues with hemorrhoids.  She states the area has not been any bleeding, but reports itching, and does not feel clean when wiping.   ROS: All ROS reviewed and negative except as per HPI   Past Medical History:  Diagnosis Date   Anxiety    Depression    Hyperthyroidism    Hypothyroidism    history resolved with first pregnancy   PCOS (polycystic ovarian syndrome)     Past Surgical History:  Procedure Laterality Date   ANKLE SURGERY Left     Prior to Admission medications   Medication Sig Start Date End Date Taking? Authorizing Provider  acetaminophen (TYLENOL) 500 MG tablet Take 500 mg by mouth every 6 (six) hours as needed for mild pain or headache.   Yes [provider]  escitalopram (LEXAPRO) 20 MG tablet Take 1 tablet (20 mg total) by mouth daily. Patient taking differently: Take 20 mg by mouth at bedtime. 10/03/20  Yes Margaretann Loveless, PA-C  hydrocortisone 1 % ointment Apply 1 application topically at bedtime. Patient taking differently: Apply 1 application topically daily as needed for itching. 08/10/20  Yes Nicole Dyer B, MD  ibuprofen (ADVIL) 600 MG tablet Take 1 tablet (600 mg total) by mouth every 6  (six) hours. 11/04/20  Yes Meisinger, Todd, MD  Prenatal Vit-Fe Fumarate-FA (PRENATAL MULTIVITAMIN) TABS tablet Take 1 tablet by mouth at bedtime.   Yes [provider]  psyllium (METAMUCIL) 58.6 % powder Take 1 packet by mouth daily. 08/10/20  Yes Pasty Spillers, MD  sucralfate (CARAFATE) 1 g tablet Take 1 tablet by mouth 4 (four) times daily as needed (for heartburn). 02/15/20  Yes [provider]  famotidine (PEPCID) 20 MG tablet Take 1 tablet (20 mg total) by mouth daily. 02/23/21   Pasty Spillers, MD    Family History  Problem Relation Age of Onset   Cancer Mother        thyroid   Cancer Father    Hypertension Father      Social History   Tobacco Use   Smoking status: Never   Smokeless tobacco: Never  Vaping Use   Vaping Use: Never used  Substance Use Topics   Alcohol use: Never   Drug use: Yes    Frequency: 3.0 times per week    Types: Marijuana    Allergies as of 02/23/2021 - Review Complete 02/23/2021  Allergen Reaction Noted   Poison ivy extract Swelling and Rash 04/01/2019   Bee venom Swelling 05/04/2019    Physical Examination:  Constitutional: General:   Alert,  Well-developed, well-nourished, pleasant and cooperative in NAD BP 96/70   Pulse 80   Temp 97.7 F (36.5 C) (Oral)  Ht 5\' 10"  (1.778 m)   Wt 215 lb 9.6 oz (97.8 kg)   BMI 30.94 kg/m   Respiratory: Normal respiratory effort  Gastrointestinal:  Soft, non-tender and non-distended without masses, hepatosplenomegaly or hernias noted.  No guarding or rebound tenderness.     Rectal exam: Small external hemorrhoid/skin tags present.  No bleeding present.  DRE with no mass in the rectal vault.  Brown stool present in rectal vault with no blood.  Cardiac: No clubbing or edema.  No cyanosis. Normal posterior tibial pedal pulses noted.  Psych:  Alert and cooperative. Normal mood and affect.  Musculoskeletal:  Normal gait. Head normocephalic, atraumatic. Symmetrical without  gross deformities. 5/5 Lower extremity strength bilaterally.  Skin: Warm. Intact without significant lesions or rashes. No jaundice.  Neck: Supple, trachea midline  Lymph: No cervical lymphadenopathy  Psych:  Alert and oriented x3, Alert and cooperative. Normal mood and affect.  Labs: CMP     Component Value Date/Time   NA 141 11/13/2019 1521   K 4.0 11/13/2019 1521   CL 104 11/13/2019 1521   CO2 23 11/13/2019 1521   GLUCOSE 80 11/13/2019 1521   BUN 8 11/13/2019 1521   CREATININE 0.73 11/13/2019 1521   CALCIUM 9.3 11/13/2019 1521   PROT 6.8 11/13/2019 1521   ALBUMIN 4.4 11/13/2019 1521   AST 15 11/13/2019 1521   ALT 13 11/13/2019 1521   ALKPHOS 95 11/13/2019 1521   BILITOT 0.4 11/13/2019 1521   GFRNONAA 112 11/13/2019 1521   GFRAA 130 11/13/2019 1521   Lab Results  Component Value Date   WBC 8.0 11/03/2020   HGB 11.2 (L) 11/03/2020   HCT 33.8 (L) 11/03/2020   MCV 88.5 11/03/2020   PLT 222 11/03/2020    Imaging Studies:   Assessment and Plan:   Nicole Dyer is a 30 y.o. y/o female here for follow-up of heartburn  Heartburn has resolved with twice daily Pepcid Patient willing to decrease Pepcid to once a day  If symptoms return, patient advised to call 37 back.  We have to keep in mind that she is breast-feeding and also has to take care of 19 kids, a 64-month-old and a 35-year-old, which is affecting her diet and she is doing the best she can as far as lifestyle modifications to help with her symptoms as well  See previous note for discussion of breast-feeding considerations with H2 RA and PPI therapy   Patient would like surgical evaluation as she feels she has dealt with her hemorrhoids and itching from them for a while and topical treatments helped temporarily but symptoms continue and have worsened since her second pregnancy.  We will place surgical referral  Her main complaint is that she does not feel clean after wiping due to the presence of the external  hemorrhoids.  I have advised wet wipes, or using bidet attachments that can be purchased that allow for use of water to clean as this may help with her complaint in this regard.  Patient denies any bright red per rectum  No alarm symptoms present at this time    Dr 3

## 2021-03-06 DIAGNOSIS — F410 Panic disorder [episodic paroxysmal anxiety] without agoraphobia: Secondary | ICD-10-CM | POA: Diagnosis not present

## 2021-03-13 DIAGNOSIS — F410 Panic disorder [episodic paroxysmal anxiety] without agoraphobia: Secondary | ICD-10-CM | POA: Diagnosis not present

## 2021-04-12 DIAGNOSIS — M792 Neuralgia and neuritis, unspecified: Secondary | ICD-10-CM | POA: Diagnosis not present

## 2021-04-12 DIAGNOSIS — M19172 Post-traumatic osteoarthritis, left ankle and foot: Secondary | ICD-10-CM | POA: Diagnosis not present

## 2021-04-14 DIAGNOSIS — F410 Panic disorder [episodic paroxysmal anxiety] without agoraphobia: Secondary | ICD-10-CM | POA: Diagnosis not present

## 2021-04-27 DIAGNOSIS — M19172 Post-traumatic osteoarthritis, left ankle and foot: Secondary | ICD-10-CM | POA: Diagnosis not present

## 2021-05-08 DIAGNOSIS — M19172 Post-traumatic osteoarthritis, left ankle and foot: Secondary | ICD-10-CM | POA: Diagnosis not present

## 2021-06-28 ENCOUNTER — Ambulatory Visit: Payer: Medicaid Other | Admitting: Gastroenterology

## 2021-07-21 DIAGNOSIS — F410 Panic disorder [episodic paroxysmal anxiety] without agoraphobia: Secondary | ICD-10-CM | POA: Diagnosis not present

## 2021-08-21 DIAGNOSIS — Z30431 Encounter for routine checking of intrauterine contraceptive device: Secondary | ICD-10-CM | POA: Diagnosis not present

## 2021-08-21 DIAGNOSIS — N760 Acute vaginitis: Secondary | ICD-10-CM | POA: Diagnosis not present

## 2021-08-21 DIAGNOSIS — Z8639 Personal history of other endocrine, nutritional and metabolic disease: Secondary | ICD-10-CM | POA: Diagnosis not present

## 2021-08-21 LAB — TSH: TSH: 1.58 (ref <=5.90)

## 2021-08-30 DIAGNOSIS — F410 Panic disorder [episodic paroxysmal anxiety] without agoraphobia: Secondary | ICD-10-CM | POA: Diagnosis not present

## 2021-10-06 ENCOUNTER — Telehealth: Payer: Self-pay | Admitting: Physician Assistant

## 2021-10-06 NOTE — Telephone Encounter (Signed)
[facility] faxed refill request for the following medications:   escitalopram (LEXAPRO) 20 MG tablet   Please advise.  

## 2021-10-09 NOTE — Telephone Encounter (Signed)
Will deny request, patient needs office visit. Patient last had prescription filled 10/03/20 , last office visit 04/22/20. KW ?

## 2021-10-10 ENCOUNTER — Ambulatory Visit: Payer: Medicaid Other | Admitting: Physician Assistant

## 2021-10-10 ENCOUNTER — Encounter: Payer: Self-pay | Admitting: Family Medicine

## 2021-10-10 ENCOUNTER — Other Ambulatory Visit: Payer: Self-pay

## 2021-10-10 ENCOUNTER — Encounter: Payer: Self-pay | Admitting: Physician Assistant

## 2021-10-10 VITALS — BP 116/73 | HR 85 | Temp 98.4°F | Resp 16 | Ht 70.0 in | Wt 209.4 lb

## 2021-10-10 DIAGNOSIS — F331 Major depressive disorder, recurrent, moderate: Secondary | ICD-10-CM

## 2021-10-10 DIAGNOSIS — F419 Anxiety disorder, unspecified: Secondary | ICD-10-CM | POA: Diagnosis not present

## 2021-10-10 DIAGNOSIS — Z23 Encounter for immunization: Secondary | ICD-10-CM

## 2021-10-10 DIAGNOSIS — Z1283 Encounter for screening for malignant neoplasm of skin: Secondary | ICD-10-CM | POA: Diagnosis not present

## 2021-10-10 DIAGNOSIS — E282 Polycystic ovarian syndrome: Secondary | ICD-10-CM

## 2021-10-10 MED ORDER — ESCITALOPRAM OXALATE 20 MG PO TABS
20.0000 mg | ORAL_TABLET | Freq: Every day | ORAL | 2 refills | Status: DC
Start: 2021-10-10 — End: 2022-01-03

## 2021-10-10 NOTE — Progress Notes (Signed)
?  ? ? ?Established patient visit ? ? ?Patient: Nicole Dyer   DOB: September 14, 1990   30 y.o. Female  MRN: [id number] ?Visit Date: 10/10/2021 ? ? ?I,Joseline E Rosas,acting as a scribe for [employer], PA-C.,have documented all relevant documentation on the behalf of Kash Mothershead E Naureen Benton, PA-C,as directed by  [employer], PA-C while in the presence of Anamaria Dusenbury E Kano Heckmann, PA-C.  ? ?Today's healthcare provider: Oswaldo Conroy Wilhemenia Camba, PA-C  ?Introduced myself to the patient as a Secondary school teacher and provided education on APPs in clinical practice.  ? ? ? ?Chief Complaint  ?Patient presents with  ? Medication Refill  ? ?Subjective  ?  ?HPI  ?Depression, Follow-up ? ?She  was last seen for this 1 years  and 6 months ago. ?Changes made at last visit include change therapy to sertraline due to pregnancy.Patient's ob/gyn placed her back on escitalopram 20mg .  ?  ?She reports excellent compliance with treatment. ?She is not having side effects.  ? ?She reports excellent tolerance of treatment. ?Current symptoms include: depressed mood, difficulty concentrating, insomnia, and emotional  in the few days because was out of medicine. ?She feels she is Improved since last visit. ? ?Depression screen [facility] 2/9 10/10/2021 03/25/2020 02/15/2020  ?Decreased Interest 2 2 2   ?Down, Depressed, Hopeless 2 1 1   ?PHQ - 2 Score 4 3 3   ?Altered sleeping 3 2 3   ?Tired, decreased energy 3 3 3   ?Change in appetite 2 3 2   ?Feeling bad or failure about yourself  1 1 1   ?Trouble concentrating 3 1 2   ?Moving slowly or fidgety/restless 1 0 1  ?Suicidal thoughts 0 0 0  ?PHQ-9 Score 17 13 15   ?Difficult doing work/chores Somewhat difficult Very difficult Very difficult  ?  ? ?States she felt like she was doing well on Escitalopram but then ran out and hasn't been on it for about a week ?States she is having trouble with concentration and handling her emotions at this time ? ? ? ?-----------------------------------------------------------------------------------------  ?GAD 7 : Generalized  Anxiety Score 10/10/2021 12/18/2019  ?Nervous, Anxious, on Edge 2 1  ?Control/stop worrying 2 1  ?Worry too much - different things 3 2  ?Trouble relaxing 3 1  ?Restless 2 2  ?Easily annoyed or irritable 3 2  ?Afraid - awful might happen 0 0  ?Total GAD 7 Score 15 9  ?Anxiety Difficulty Somewhat difficult Somewhat difficult  ? ?GERD: patient doing well on the Pepcid and Sucralfate is prn. ?  ? ?States she was undergoing monitoring for thyroid issues. States she has issues with sweating ?Reports night sweats  ?Reports AM nausea and vomiting  ?States she overall "doesn't feel good, feels sickly and drained" ?States she had her TSH and lab work checked recently - last month, maybe Jan  ? ? ? ? ? ?Medications: ?Outpatient Medications Prior to Visit  ?Medication Sig  ? acetaminophen (TYLENOL) 500 MG tablet Take 500 mg by mouth every 6 (six) hours as needed for mild pain or headache.  ? famotidine (PEPCID) 20 MG tablet Take 1 tablet (20 mg total) by mouth daily.  ? hydrocortisone 1 % ointment Apply 1 application topically at bedtime. (Patient taking differently: Apply 1 application. topically daily as needed for itching.)  ? ibuprofen (ADVIL) 600 MG tablet Take 1 tablet (600 mg total) by mouth every 6 (six) hours.  ? Multiple Vitamins-Minerals (MULTIVITAMIN ADULT) CHEW Chew by mouth.  ? psyllium (METAMUCIL) 58.6 % powder Take 1 packet by mouth daily.  ? [  DISCONTINUED] escitalopram (LEXAPRO) 20 MG tablet Take 1 tablet (20 mg total) by mouth daily. (Patient taking differently: Take 20 mg by mouth at bedtime.)  ? [DISCONTINUED] Prenatal Vit-Fe Fumarate-FA (PRENATAL MULTIVITAMIN) TABS tablet Take 1 tablet by mouth at bedtime.  ? [DISCONTINUED] sucralfate (CARAFATE) 1 g tablet Take 1 tablet by mouth 4 (four) times daily as needed (for heartburn). (Patient not taking: Reported on 10/10/2021)  ? ?No facility-administered medications prior to visit.  ? ? ?Review of Systems  ?Constitutional:  Positive for diaphoresis.  ?Hematological:   Positive for adenopathy.  ?Psychiatric/Behavioral:  Positive for decreased concentration and dysphoric mood. Negative for self-injury and suicidal ideas. The patient is nervous/anxious.   ? ? ?  Objective  ?  ?BP 116/73 (BP Location: Left Arm, Patient Position: Sitting, Cuff Size: Normal)   Pulse 85   Temp 98.4 ?F (36.9 ?C) (Oral)   Resp 16   Ht 5\' 10"  (1.778 m)   Wt 209 lb 6.4 oz (95 kg)   BMI 30.05 kg/m?  ? ? ?Physical Exam ?Vitals reviewed.  ?Constitutional:   ?   Appearance: Normal appearance.  ?HENT:  ?   Head: Normocephalic and atraumatic.  ?Cardiovascular:  ?   Rate and Rhythm: Normal rate and regular rhythm.  ?   Pulses: Normal pulses.  ?   Heart sounds: Normal heart sounds.  ?Pulmonary:  ?   Effort: Pulmonary effort is normal.  ?   Breath sounds: Normal breath sounds. No wheezing, rhonchi or rales.  ?Neurological:  ?   General: No focal deficit present.  ?   Mental Status: She is alert and oriented to person, place, and time.  ?Psychiatric:     ?   Mood and Affect: Mood normal.     ?   Behavior: Behavior normal.     ?   Thought Content: Thought content normal.     ?   Judgment: Judgment normal.  ?  ? ? ?No results found for any visits on 10/10/21. ? Assessment & Plan  ?  ? ? ?Problem List Items Addressed This Visit   ? ?  ? Endocrine  ? PCOS (polycystic ovarian syndrome)  ?  Chronic per patient  ?Patient reports her previous PCP was evaluating her for PCOS as well as thyroid irregularities  ?Recommend she have her most recent lab work sent to office to review so PCP can discuss and make potential interventions ? ?  ?  ?  ? Other  ? Anxiety  ?  Chronic, historic condition  ?Likely with current exacerbation due to increased GAD7 scores  ?Patient has been out of LExapro for about a week which is likely contributing to this increased score ?Will refill Lexapro 20mg  PO QD  ?Recommend she return in 3 months to assess progress ? ?  ?  ? Relevant Medications  ? escitalopram (LEXAPRO) 20 MG tablet  ?  Depression - Primary  ?  Chronic, historic condition  ?Likely with exacerbation due to running out of lexapro  ?Will refill Lexapro 20 mg PO QD at this time ?Recommend follow up in 3 months with PCP to reassess PHQ9 and GAD7 ? ?  ?  ? Relevant Medications  ? escitalopram (LEXAPRO) 20 MG tablet  ? ?Other Visit Diagnoses   ? ? Need for influenza vaccination      ? Relevant Orders  ? Flu Vaccine QUAD 5mo+IM (Fluarix, Fluzone & Alfiuria Quad PF) (Completed)  ? Skin exam, screening for cancer      ?  Relevant Orders  ? Ambulatory referral to Dermatology  ? ?  ? ? ? ?Return in about 3 months (around 01/10/2022) for chronic condition management . ? ? ?I, Joeseph Verville E Yailene Badia, PA-C, have reviewed all documentation for this visit. The documentation on 10/10/21 for the exam, diagnosis, procedures, and orders are all accurate and complete. ? ? ?Shamel Germond, Mirian Mo MPH ?[facility] ?[facility] ? ? ? ?Return in about 3 months (around 01/10/2022) for chronic condition management .  ?   ? ? ? ? ?Ventura Hollenbeck E Issis Lindseth, PA-C  ?[facility] ?[phone] (phone) ?[phone] (fax) ? ?[facility]  ?

## 2021-10-10 NOTE — Assessment & Plan Note (Signed)
Chronic, historic condition  ?Likely with current exacerbation due to increased GAD7 scores  ?Patient has been out of LExapro for about a week which is likely contributing to this increased score ?Will refill Lexapro 20mg  PO QD  ?Recommend she return in 3 months to assess progress ? ?

## 2021-10-10 NOTE — Assessment & Plan Note (Signed)
Chronic per patient  ?Patient reports her previous PCP was evaluating her for PCOS as well as thyroid irregularities  ?Recommend she have her most recent lab work sent to office to review so PCP can discuss and make potential interventions ? ?

## 2021-10-10 NOTE — Assessment & Plan Note (Signed)
Chronic, historic condition  ?Likely with exacerbation due to running out of lexapro  ?Will refill Lexapro 20 mg PO QD at this time ?Recommend follow up in 3 months with PCP to reassess PHQ9 and GAD7 ? ?

## 2021-10-30 IMAGING — US US THYROID
1 series · 13 of 25 positions shown · non-contrast
Comparison: None.

CLINICAL DATA: Hyperthyroid.  No palpable thyroid nodules.

EXAM:
THYROID ULTRASOUND
TECHNIQUE: Ultrasound examination of the thyroid gland and adjacent soft
tissues was performed.

[Series 1: us thyroid · 13 of 54 slices shown]
[im 1/54]
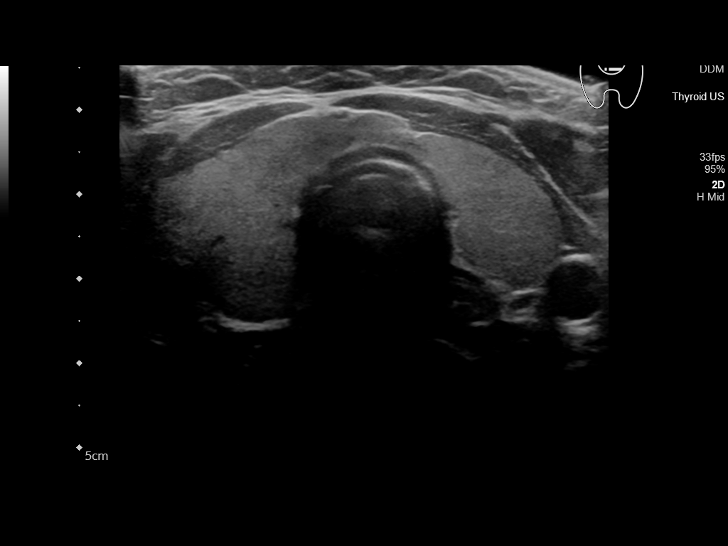
[im 5/54]
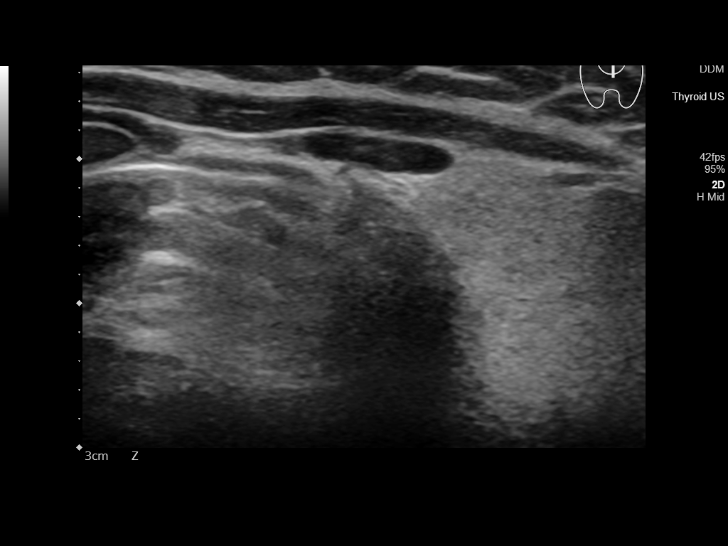
[im 9/54]
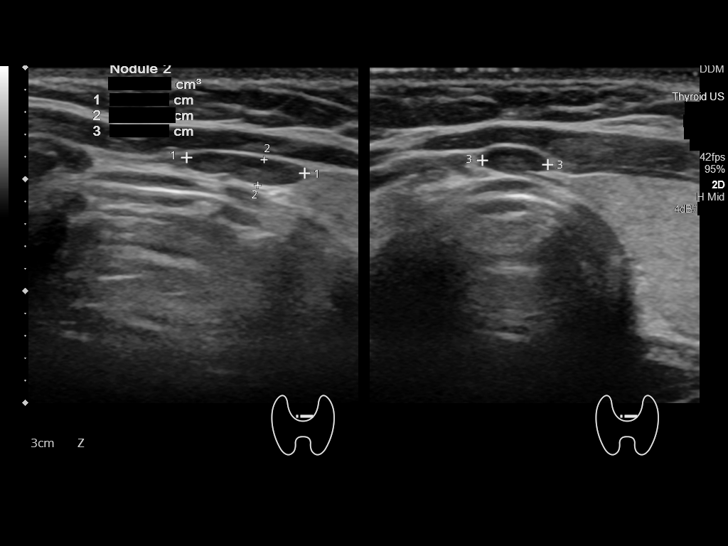
[im 14/54]
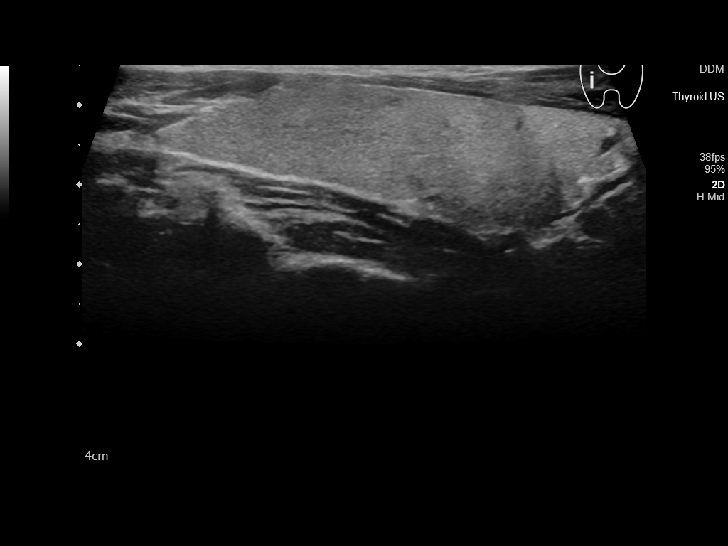
[im 18/54]
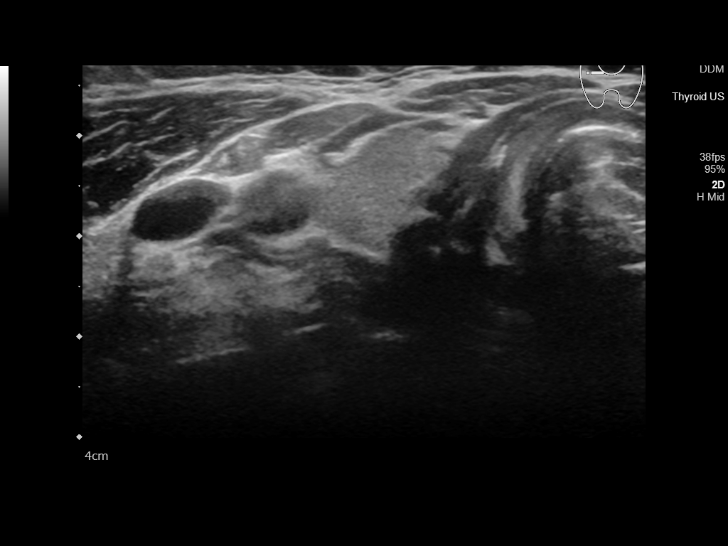
[im 23/54]
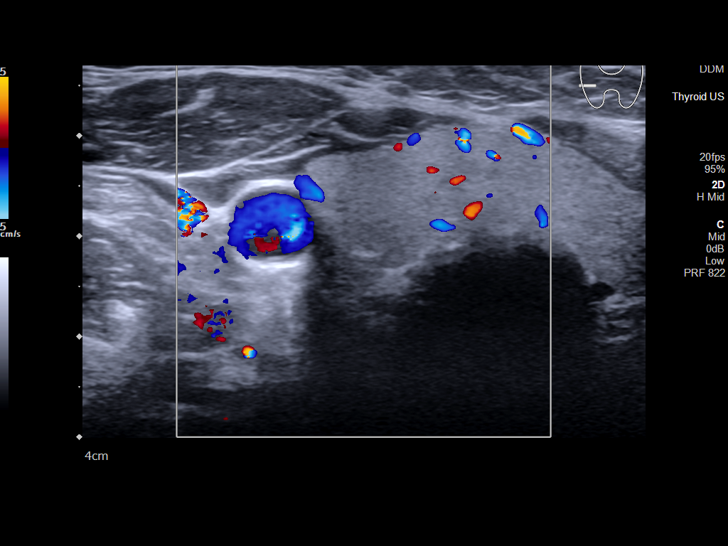
[im 27/54]
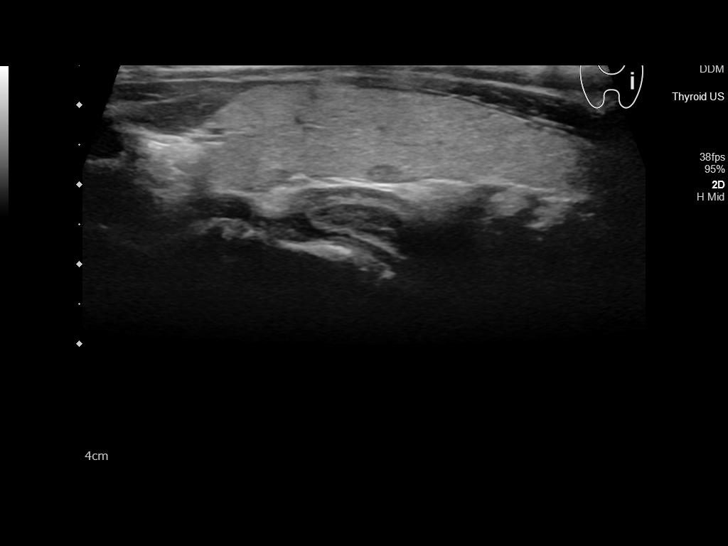
[im 31/54]
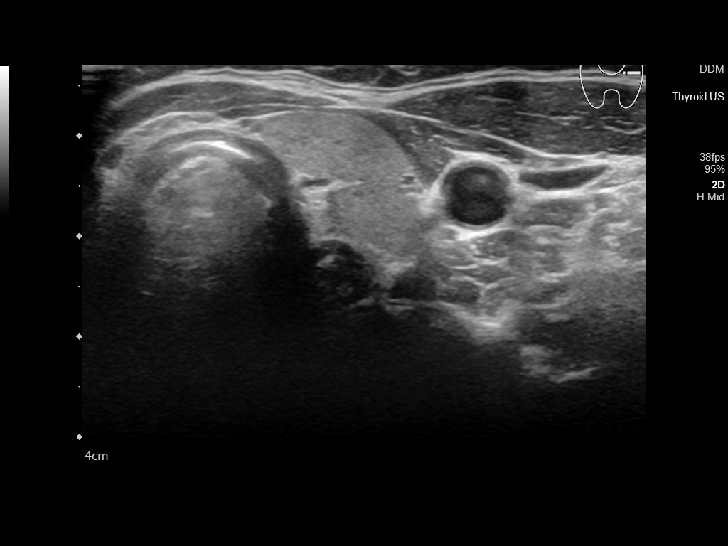
[im 36/54]
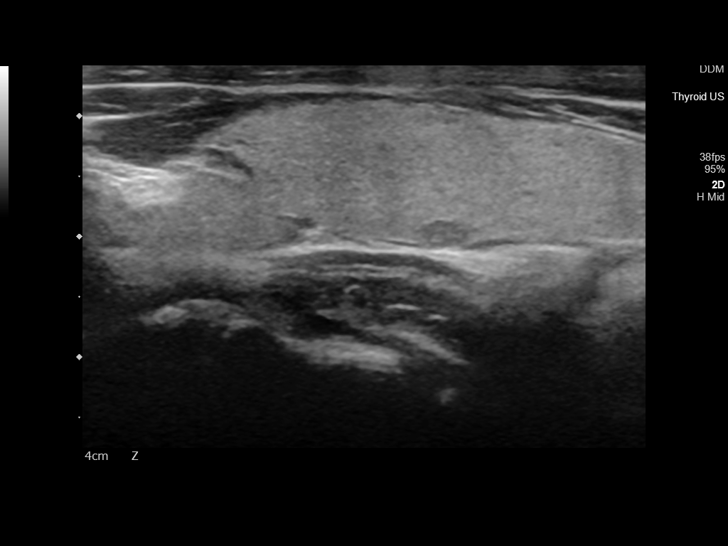
[im 40/54]
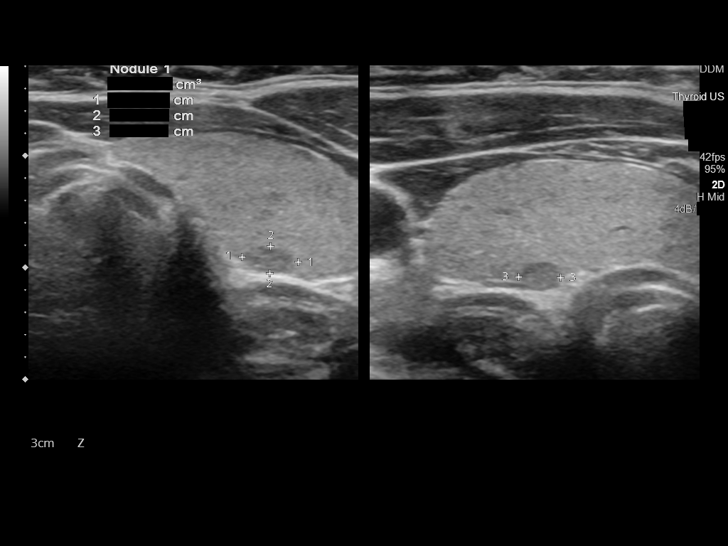
[im 45/54]
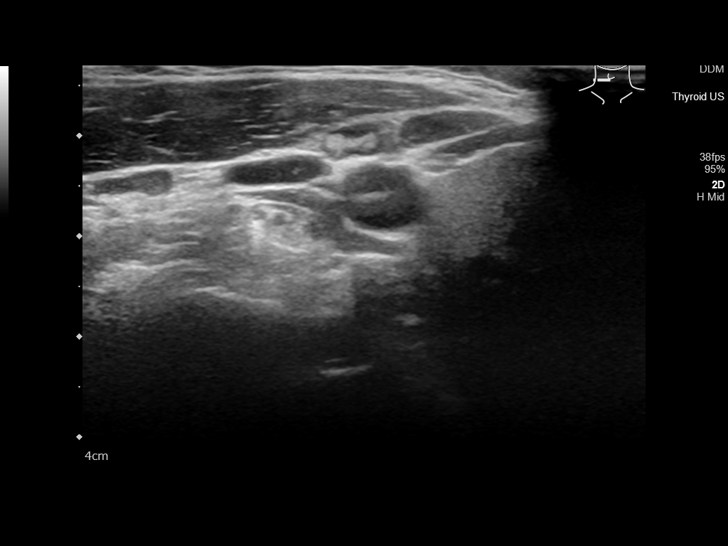
[im 49/54]
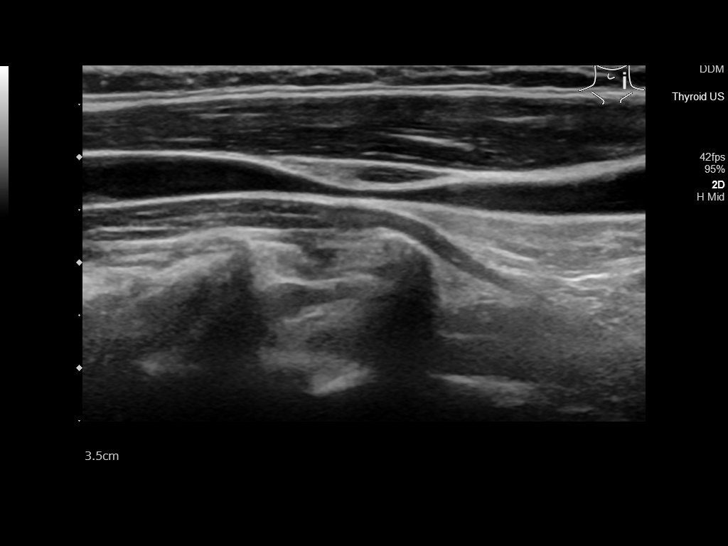
[im 54/54]
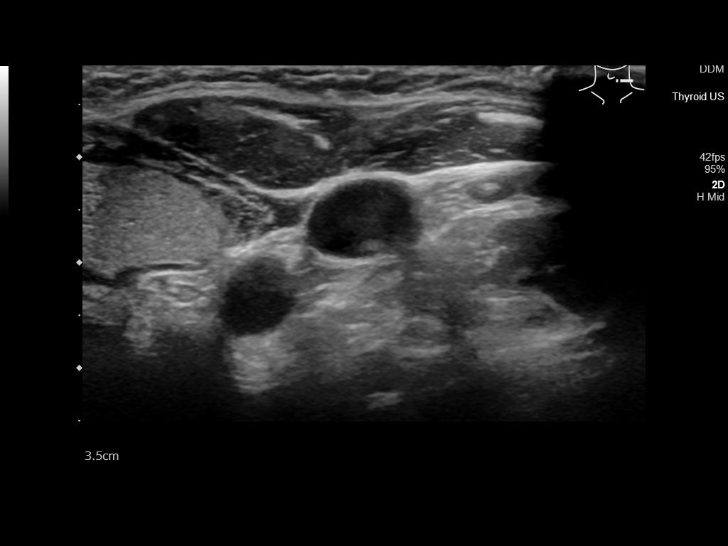

[13 of 25 positions shown; findings below may reference images not displayed]

FINDINGS: Parenchymal Echotexture: Normal - no definitive glandular hyperemia.

Isthmus: Normal in size scratch the borderline enlarged measures
cm in diameter

Right lobe: Borderline enlarged measures 6.1 x 1.6 x 2.1 cm

Left lobe: Borderline enlarged measuring 5.1 x 1.1 x 1.5 cm

_________________________________________________________

Estimated total number of nodules >/= 1 cm: 1

Number of spongiform nodules >/=  2 cm not described below (TR1): 0

Number of mixed cystic and solid nodules >/= 1.5 cm not described
below (TR2): 0

_________________________________________________________

There is an approximately 0.5 x 0.4 x 0.3 cm hypoechoic nodule with
the mid, posterior aspect of the left lobe of the thyroid (labeled
1), which does not meet criteria to recommend percutaneous sampling
or continued dedicated follow-up.

Adjacent to the thyroid isthmus is a benign-appearing, non
pathologically enlarged cervical lymph node which is not enlarged by
size criteria, measuring 0.2 cm in greatest short axis diameter.
IMPRESSION: Borderline enlarged, but otherwise normal-appearing thyroid, without
worrisome nodule or mass.

## 2022-01-03 ENCOUNTER — Other Ambulatory Visit: Payer: Self-pay | Admitting: Physician Assistant

## 2022-01-03 DIAGNOSIS — F419 Anxiety disorder, unspecified: Secondary | ICD-10-CM

## 2022-01-03 DIAGNOSIS — F331 Major depressive disorder, recurrent, moderate: Secondary | ICD-10-CM

## 2022-01-03 NOTE — Telephone Encounter (Signed)
Requested Prescriptions  Pending Prescriptions Disp Refills  . escitalopram (LEXAPRO) 20 MG tablet [Pharmacy Med Name: ESCITALOPRAM 20MG  TABLETS] 30 tablet 2    Sig: TAKE 1 TABLET(20 MG) BY MOUTH AT BEDTIME     Psychiatry:  Antidepressants - SSRI Passed - 01/03/2022  6:13 AM      Passed - Completed PHQ-2 or PHQ-9 in the last 360 days      Passed - Valid encounter within last 6 months    Recent Outpatient Visits          2 months ago Moderate episode of recurrent major depressive disorder (HCC)   01/05/2022, [employer], PA-C   1 year ago Postpartum depression   [facility] [place], [place], Alessandra Bevels   1 year ago Anxiety   [facility] [place], [place], Alessandra Bevels   1 year ago Nausea   [facility] [place], [place], Alessandra Bevels   2 years ago Hyperthyroidism   [facility] [place], [place], Alessandra Bevels      Future Appointments            In 1 week [place], FNP [facility], [facility]   In 3 months [facility], MD [facility]

## 2022-01-05 NOTE — Progress Notes (Signed)
Established patient visit   Patient: Nicole Dyer   DOB: [birth year]   31 y.o. Female  MRN: [id number] Visit Date: 01/10/2022  Today's healthcare provider: Gwyneth Sprout, FNP  Patient presents for new patient visit to establish care.  Introduced to Designer, jewellery role and practice setting.  All questions answered.  Discussed provider/patient relationship and expectations.   I,Tiffany J Bragg,acting as a scribe for Gwyneth Sprout, FNP.,have documented all relevant documentation on the behalf of Gwyneth Sprout, FNP,as directed by  Gwyneth Sprout, FNP while in the presence of Gwyneth Sprout, FNP.   Chief Complaint  Patient presents with   Depression   Subjective    HPI  Depression, Follow-up  She  was last seen for this 3 months ago. Changes made at last visit include continue medication.   She reports excellent compliance with treatment. She is not having side effects.   She reports excellent tolerance of treatment. Current symptoms include: difficulty concentrating and anxiety She feels she is Improved since last visit.     01/10/2022    4:06 PM 10/10/2021    3:36 PM 03/25/2020    5:13 PM  Depression screen PHQ 2/9  Decreased Interest 0 2 2  Down, Depressed, Hopeless 1 2 1   PHQ - 2 Score 1 4 3   Altered sleeping 2 3 2   Tired, decreased energy 2 3 3   Change in appetite 2 2 3   Feeling bad or failure about yourself  0 1 1  Trouble concentrating 1 3 1   Moving slowly or fidgety/restless 0 1 0  Suicidal thoughts 0 0 0  PHQ-9 Score 8 17 13   Difficult doing work/chores Somewhat difficult Somewhat difficult Very difficult    -----------------------------------------------------------------------------------------   Medications: Outpatient Medications Prior to Visit  Medication Sig   acetaminophen (TYLENOL) 500 MG tablet Take 500 mg by mouth every 6 (six) hours as needed for mild pain or headache.   famotidine (PEPCID) 20 MG tablet Take 1 tablet (20 mg total) by  mouth daily.   hydrocortisone 1 % ointment Apply 1 application topically at bedtime. (Patient taking differently: Apply 1 application. topically daily as needed for itching.)   ibuprofen (ADVIL) 600 MG tablet Take 1 tablet (600 mg total) by mouth every 6 (six) hours.   Multiple Vitamins-Minerals (MULTIVITAMIN ADULT) CHEW Chew by mouth.   psyllium (METAMUCIL) 58.6 % powder Take 1 packet by mouth daily.   [DISCONTINUED] escitalopram (LEXAPRO) 20 MG tablet TAKE 1 TABLET(20 MG) BY MOUTH AT BEDTIME   No facility-administered medications prior to visit.    Review of Systems     Objective    BP 108/73 (BP Location: Right Arm, Patient Position: Sitting, Cuff Size: Normal)   Pulse 62   Temp 98.7 F (37.1 C) (Oral)   Resp 16   Ht 5\' 10"  (1.778 m)   Wt 209 lb (94.8 kg)   LMP  (LMP Unknown)   SpO2 98%   Breastfeeding No   BMI 29.99 kg/m    Physical Exam Vitals and nursing note reviewed.  Constitutional:      General: She is not in acute distress.    Appearance: Normal appearance. She is obese. She is not ill-appearing, toxic-appearing or diaphoretic.  HENT:     Head: Normocephalic and atraumatic.  Cardiovascular:     Rate and Rhythm: Normal rate and regular rhythm.     Pulses: Normal pulses.     Heart sounds: Normal heart sounds. No murmur heard.  No friction rub. No gallop.  Pulmonary:     Effort: Pulmonary effort is normal. No respiratory distress.     Breath sounds: Normal breath sounds. No stridor. No wheezing, rhonchi or rales.  Chest:     Chest wall: No tenderness.  Abdominal:     General: Bowel sounds are normal.     Palpations: Abdomen is soft.  Musculoskeletal:        General: No swelling, tenderness, deformity or signs of injury. Normal range of motion.     Right lower leg: No edema.     Left lower leg: No edema.  Skin:    General: Skin is warm and dry.     Capillary Refill: Capillary refill takes less than 2 seconds.     Coloration: Skin is not jaundiced or  pale.     Findings: No bruising, erythema, lesion or rash.  Neurological:     General: No focal deficit present.     Mental Status: She is alert and oriented to person, place, and time. Mental status is at baseline.     Cranial Nerves: No cranial nerve deficit.     Sensory: No sensory deficit.     Motor: No weakness.     Coordination: Coordination normal.  Psychiatric:        Mood and Affect: Mood normal. Affect is flat.        Behavior: Behavior normal.        Thought Content: Thought content normal.        Judgment: Judgment normal.     No results found for any visits on 01/10/22.  Assessment & Plan     Problem List Items Addressed This Visit       Other   Adjustment disorder with mixed anxiety and depressed mood    Unwed, raising 2 young kids, 3 and 15 months, without child support Lots of recent illness in kids Feels exhausted and likes she has to parent their father too Denies SI or HI at this time; will re-evaluate prn.      Relevant Medications   busPIRone (BUSPAR) 7.5 MG tablet   Anxiety    Acute on chronic, has a good routine However, finds it difficult to relax and hard to unwind Finds herself more irritable Recommend additional of buspar BID at mid level dose with strict feedback on if the medication is effective for patient or not. Patient is far too active for use of vistaril and does not wish to use benzo and I wish to respect both        Relevant Medications   escitalopram (LEXAPRO) 20 MG tablet   busPIRone (BUSPAR) 7.5 MG tablet   Depression - Primary    Acute on chronic, Ideally wishes to come off and do a wash out of medication; however, given previous time off medication and on going stressor do not recommend that at this time Would recommend increase of medication, 2- 20's (40 mg) and revisit if stressors lesson in 6 months Pt is unable to meet with therapist any more frequently d/t other absences at work, however, she feels confident to meet with  her as needed as they have been working together for >2.5 years.        Relevant Medications   escitalopram (LEXAPRO) 20 MG tablet   busPIRone (BUSPAR) 7.5 MG tablet   Flatulence/gas pain/belching    Chronic, variable Previously seen by GI Trial of reglan provided Encouraged to try 5 mg with meals and then increased to  goal of 10 mg QID Can add in simethicone OTC prn as well; pt reports that she has routine BMs and does not fear that constipation is to blame Has previously tried use of PPI and use of carafate but continues to have an esophageal spasm Would recommend BMI closer to 25 Recommend trial of separation of food from drink at meal times F/u in 6-8 weeks with anxiety Will refer back to GI if needed       Relevant Medications   metoCLOPramide (REGLAN) 10 MG tablet     Return in about 8 weeks (around 03/07/2022) for anxiety and depression-OK for online.      Vonna Kotyk, FNP, have reviewed all documentation for this visit. The documentation on 01/10/22 for the exam, diagnosis, procedures, and orders are all accurate and complete.    Gwyneth Sprout, [place] [phone] (phone) [phone] (fax)  [place]

## 2022-01-10 ENCOUNTER — Ambulatory Visit: Payer: Medicaid Other | Admitting: Family Medicine

## 2022-01-10 ENCOUNTER — Encounter: Payer: Self-pay | Admitting: Family Medicine

## 2022-01-10 VITALS — BP 108/73 | HR 62 | Temp 98.7°F | Resp 16 | Ht 70.0 in | Wt 209.0 lb

## 2022-01-10 DIAGNOSIS — R140 Abdominal distension (gaseous): Secondary | ICD-10-CM | POA: Insufficient documentation

## 2022-01-10 DIAGNOSIS — F4323 Adjustment disorder with mixed anxiety and depressed mood: Secondary | ICD-10-CM | POA: Diagnosis not present

## 2022-01-10 DIAGNOSIS — F331 Major depressive disorder, recurrent, moderate: Secondary | ICD-10-CM | POA: Diagnosis not present

## 2022-01-10 DIAGNOSIS — F419 Anxiety disorder, unspecified: Secondary | ICD-10-CM

## 2022-01-10 MED ORDER — ESCITALOPRAM OXALATE 20 MG PO TABS: 40.0000 mg | ORAL_TABLET | Freq: Every day | ORAL | 1 refills | Status: DC

## 2022-01-10 MED ORDER — BUSPIRONE HCL 7.5 MG PO TABS: 7.5000 mg | ORAL_TABLET | Freq: Two times a day (BID) | ORAL | 0 refills | Status: DC

## 2022-01-10 MED ORDER — METOCLOPRAMIDE HCL 10 MG PO TABS: 10.0000 mg | ORAL_TABLET | Freq: Three times a day (TID) | ORAL | 0 refills | Status: DC

## 2022-01-10 NOTE — Assessment & Plan Note (Addendum)
Acute on chronic, Ideally wishes to come off and do a wash out of medication; however, given previous time off medication and on going stressor do not recommend that at this time Would recommend increase of medication, 2- 20's (40 mg) and revisit if stressors lesson in 6 months Pt is unable to meet with therapist any more frequently d/t other absences at work, however, she feels confident to meet with her as needed as they have been working together for >2.5 years.

## 2022-01-10 NOTE — Assessment & Plan Note (Signed)
Chronic, variable Previously seen by GI Trial of reglan provided Encouraged to try 5 mg with meals and then increased to goal of 10 mg QID Can add in simethicone OTC prn as well; pt reports that she has routine BMs and does not fear that constipation is to blame Has previously tried use of PPI and use of carafate but continues to have an esophageal spasm Would recommend BMI closer to 25 Recommend trial of separation of food from drink at meal times F/u in 6-8 weeks with anxiety Will refer back to GI if needed

## 2022-01-10 NOTE — Assessment & Plan Note (Signed)
Acute on chronic, has a good routine However, finds it difficult to relax and hard to unwind Finds herself more irritable Recommend additional of buspar BID at mid level dose with strict feedback on if the medication is effective for patient or not. Patient is far too active for use of vistaril and does not wish to use benzo and I wish to respect both

## 2022-01-10 NOTE — Assessment & Plan Note (Addendum)
Unwed, raising 2 young kids, 3 and 15 months, without child support Lots of recent illness in kids Feels exhausted and likes she has to parent their father too Denies SI or HI at this time; will re-evaluate prn.

## 2022-02-05 ENCOUNTER — Ambulatory Visit: Payer: Self-pay | Admitting: *Deleted

## 2022-02-05 NOTE — Telephone Encounter (Signed)
Patient advised of medication change made on last ov increase of medication, 2- 20's (40 mg)

## 2022-02-05 NOTE — Telephone Encounter (Signed)
Message from Metro Kung sent at 02/05/2022 12:47 PM EDT  Summary: discuss Rx.   Pt called in for clarity on the directions for her Rx escitalopram (LEXAPRO) 20 MG tablet. Pt says that she thought the provider made a change to her MG but isn't sure. Pt says that she want to be sure before she pick up her Rx.    Please advise.           Call History   Type Contact Phone/Fax User  02/05/2022 12:41 PM EDT Phone (Incoming) Nicole Dyer "[name]" (Self) [phone] Judie Petit) [place]ural [place], [place] C   Reason for Disposition  [1] Caller has URGENT medicine question about med that PCP or specialist prescribed AND [2] triager unable to answer question  Answer Assessment - Initial Assessment Questions 1. NAME of MEDICATION: "What medicine are you calling about?"     Lexapro 2. QUESTION: "What is your question?" (e.g., double dose of medicine, side effect)     Pharmacy has rx for 20 mg a day.   I need an rx for 2 pills a day to equal 40 mg a day sent to my pharmacy.    Dose was increased at OV on 01/10/2022. 3. PRESCRIBING HCP: "Who prescribed it?" Reason: if prescribed by specialist, call should be referred to that group.     Merita Norton, FNP 4. SYMPTOMS: "Do you have any symptoms?"     N/A 5. SEVERITY: If symptoms are present, ask "Are they mild, moderate or severe?"     N/A 6. PREGNANCY:  "Is there any chance that you are pregnant?" "When was your last menstrual period?"     N/A  Protocols used: Medication Question Call-A-AH

## 2022-02-20 ENCOUNTER — Other Ambulatory Visit: Payer: Self-pay | Admitting: Family Medicine

## 2022-02-20 DIAGNOSIS — F419 Anxiety disorder, unspecified: Secondary | ICD-10-CM

## 2022-02-20 DIAGNOSIS — F331 Major depressive disorder, recurrent, moderate: Secondary | ICD-10-CM

## 2022-02-20 NOTE — Telephone Encounter (Unsigned)
Copied from CRM [id number]. Topic: General - Other >> Feb 20, 2022  9:33 AM Everette C wrote: Reason for CRM: Medication Refill - Medication: escitalopram (LEXAPRO) 20 MG tablet [[id number]]   Has the patient contacted their pharmacy? Yes.  The patient has been directed to contact their PCP (Agent: If no, request that the patient contact the pharmacy for the refill. If patient does not wish to contact the pharmacy document the reason why and proceed with request.) (Agent: If yes, when and what did the pharmacy advise?)  Preferred Pharmacy (with phone number or street name): [id number] - [place], [place] - [address] ([address] [place] [address] Phone: [phone] Fax: [phone] Hours: Not open 24 hours   Has the patient been seen for an appointment in the last year OR does the patient have an upcoming appointment? Yes.    Agent: Please be advised that RX refills may take up to 3 business days. We ask that you follow-up with your pharmacy.

## 2022-02-21 MED ORDER — ESCITALOPRAM OXALATE 20 MG PO TABS: 40.0000 mg | ORAL_TABLET | Freq: Every day | ORAL | 1 refills | Status: DC

## 2022-02-21 NOTE — Telephone Encounter (Signed)
Call to patient- she states the pharmacy states they do not have new Rx on file- she wants to change pharmacy location- will forward Rx Requested Prescriptions  Pending Prescriptions Disp Refills  . escitalopram (LEXAPRO) 20 MG tablet 180 tablet 0    Sig: Take 2 tablets (40 mg total) by mouth at bedtime.     Psychiatry:  Antidepressants - SSRI Passed - 02/20/2022 11:52 AM      Passed - Completed PHQ-2 or PHQ-9 in the last 360 days      Passed - Valid encounter within last 6 months    Recent Outpatient Visits          1 month ago Moderate episode of recurrent major depressive disorder [facility])   [facility] Merita Norton T, FNP   4 months ago Moderate episode of recurrent major depressive disorder [facility])   [facility] Mecum, Oswaldo Conroy, PA-C   1 year ago Postpartum depression   [facility], Alessandra Bevels, [place]   1 year ago Anxiety   [facility] [place], Alessandra Bevels, [place]   2 years ago Nausea   [facility] [place], Alessandra Bevels, [place]      Future Appointments            In 1 month Deirdre Evener, MD [facility]

## 2022-04-11 ENCOUNTER — Ambulatory Visit: Payer: Medicaid Other | Admitting: Dermatology

## 2022-05-18 ENCOUNTER — Other Ambulatory Visit: Payer: Self-pay | Admitting: Family Medicine

## 2022-05-18 ENCOUNTER — Telehealth: Payer: Self-pay | Admitting: Family Medicine

## 2022-05-18 DIAGNOSIS — F419 Anxiety disorder, unspecified: Secondary | ICD-10-CM

## 2022-05-18 DIAGNOSIS — F331 Major depressive disorder, recurrent, moderate: Secondary | ICD-10-CM

## 2022-05-18 MED ORDER — ESCITALOPRAM OXALATE 20 MG PO TABS: 40.0000 mg | ORAL_TABLET | Freq: Every day | ORAL | 1 refills | Status: DC

## 2022-05-18 NOTE — Telephone Encounter (Signed)
[facility] faxed refill request for the following medications:   escitalopram (LEXAPRO) 20 MG tablet   Please advise.  

## 2022-05-21 ENCOUNTER — Other Ambulatory Visit: Payer: Self-pay | Admitting: Family Medicine

## 2022-05-21 DIAGNOSIS — F331 Major depressive disorder, recurrent, moderate: Secondary | ICD-10-CM

## 2022-05-21 DIAGNOSIS — F419 Anxiety disorder, unspecified: Secondary | ICD-10-CM

## 2022-05-21 MED ORDER — ESCITALOPRAM OXALATE 20 MG PO TABS: 20.0000 mg | ORAL_TABLET | Freq: Every day | ORAL | 3 refills | Status: DC

## 2022-06-12 ENCOUNTER — Encounter: Payer: Self-pay | Admitting: Family Medicine

## 2022-07-03 ENCOUNTER — Ambulatory Visit: Payer: Medicaid Other | Admitting: Family Medicine

## 2022-07-03 ENCOUNTER — Other Ambulatory Visit: Payer: Self-pay | Admitting: Physician Assistant

## 2022-07-03 ENCOUNTER — Encounter: Payer: Self-pay | Admitting: Physician Assistant

## 2022-07-03 ENCOUNTER — Ambulatory Visit (INDEPENDENT_AMBULATORY_CARE_PROVIDER_SITE_OTHER): Payer: Medicaid Other | Admitting: Physician Assistant

## 2022-07-03 VITALS — BP 115/59 | HR 63 | Temp 98.1°F | Resp 16 | Ht 70.0 in | Wt 213.0 lb

## 2022-07-03 DIAGNOSIS — E049 Nontoxic goiter, unspecified: Secondary | ICD-10-CM

## 2022-07-03 DIAGNOSIS — R0989 Other specified symptoms and signs involving the circulatory and respiratory systems: Secondary | ICD-10-CM | POA: Diagnosis not present

## 2022-07-03 DIAGNOSIS — R7989 Other specified abnormal findings of blood chemistry: Secondary | ICD-10-CM

## 2022-07-03 DIAGNOSIS — H6591 Unspecified nonsuppurative otitis media, right ear: Secondary | ICD-10-CM

## 2022-07-03 MED ORDER — HYDROXYZINE PAMOATE 25 MG PO CAPS
25.0000 mg | ORAL_CAPSULE | Freq: Three times a day (TID) | ORAL | 0 refills | Status: DC | PRN
Start: 2022-07-03 — End: 2023-06-24

## 2022-07-03 NOTE — Progress Notes (Unsigned)
     I,Raihana Balderrama J Leshae Mcclay,acting as a Neurosurgeon for [employer], PA-C.,have documented all relevant documentation on the behalf of Debera Lat, PA-C,as directed by  [employer], PA-C while in the presence of [employer], PA-C.   Established patient visit   Patient: Nicole Dyer   DOB: 1991/05/07   31 y.o. Female  MRN: [id number] Visit Date: 07/03/2022  Today's healthcare provider: Debera Lat, PA-C   Chief Complaint  Patient presents with   Cough    Patient complains of ongoing sinus symptoms with cough, fluid on ear, and feeling cold all the time starting 7 weeks ago. Was on antibiotic and steroid with no improvement.    Subjective    HPI HPI     Cough    Additional comments: Patient complains of ongoing sinus symptoms with cough, fluid on ear, and feeling cold all the time starting 7 weeks ago. Was on antibiotic and steroid with no improvement.       Last edited by Marlana Salvage, CMA on 07/03/2022 11:15 AM.      ***  Medications: Outpatient Medications Prior to Visit  Medication Sig   acetaminophen (TYLENOL) 500 MG tablet Take 500 mg by mouth every 6 (six) hours as needed for mild pain or headache.   busPIRone (BUSPAR) 7.5 MG tablet Take 1 tablet (7.5 mg total) by mouth 2 (two) times daily.   escitalopram (LEXAPRO) 20 MG tablet Take 1 tablet (20 mg total) by mouth at bedtime.   famotidine (PEPCID) 20 MG tablet Take 1 tablet (20 mg total) by mouth daily.   hydrocortisone 1 % ointment Apply 1 application topically at bedtime. (Patient taking differently: Apply 1 application  topically daily as needed for itching.)   ibuprofen (ADVIL) 600 MG tablet Take 1 tablet (600 mg total) by mouth every 6 (six) hours.   metoCLOPramide (REGLAN) 10 MG tablet Take 1 tablet (10 mg total) by mouth 4 (four) times daily -  before meals and at bedtime.   Multiple Vitamins-Minerals (MULTIVITAMIN ADULT) CHEW Chew by mouth.   psyllium (METAMUCIL) 58.6 % powder Take 1 packet by mouth  daily.   VENTOLIN HFA 108 (90 Base) MCG/ACT inhaler SMARTSIG:1 Puff(s) By Mouth 4 Times Daily PRN   No facility-administered medications prior to visit.    Review of Systems  {Labs  Heme  Chem  Endocrine  Serology  Results Review (optional):23779}   Objective    BP (!) 115/59 (BP Location: Right Arm, Patient Position: Sitting, Cuff Size: Large)   Pulse 63   Temp 98.1 F (36.7 C) (Oral)   Resp 16   Ht 5\' 10"  (1.778 m)   Wt 213 lb (96.6 kg)   SpO2 98%   BMI 30.56 kg/m  {Show previous vital signs (optional):23777}  Physical Exam  ***  No results found for any visits on 07/03/22.  Assessment & Plan     ***  No follow-ups on file.      {provider attestation***:1}   07/05/22, Debera Lat  [facility] [phone] (phone) [phone] (fax)  [facility]

## 2022-07-04 LAB — TSH: TSH: 1.68 u[IU]/mL (ref 0.450–4.500)

## 2022-07-06 NOTE — Progress Notes (Signed)
Please, let pt know that her TSH was WNL Her US thyroid from 01/01/20 showed: Borderline enlarged, but otherwise normal-appearing thyroid, without worrisome nodule or mass. At this point, we could wait unless she has feeling of lump in her neck or any other similar symptoms of concern.

## 2022-08-08 ENCOUNTER — Other Ambulatory Visit: Payer: Self-pay | Admitting: Family Medicine

## 2022-08-08 DIAGNOSIS — F331 Major depressive disorder, recurrent, moderate: Secondary | ICD-10-CM

## 2022-08-08 DIAGNOSIS — F419 Anxiety disorder, unspecified: Secondary | ICD-10-CM

## 2022-08-08 NOTE — Telephone Encounter (Signed)
Medication Refill - Medication: escitalopram (LEXAPRO) 20 MG tablet   Has the patient contacted their pharmacy? Yes.   (Agent: If no, request that the patient contact the pharmacy for the refill. If patient does not wish to contact the pharmacy document the reason why and proceed with request.) (Agent: If yes, when and what did the pharmacy advise?)  Preferred Pharmacy (with phone number or street name):  [facility] [address], [place]. ([address] Phone: [phone]  Fax: [phone]     Has the patient been seen for an appointment in the last year OR does the patient have an upcoming appointment? Yes.    Agent: Please be advised that RX refills may take up to 3 business days. We ask that you follow-up with your pharmacy.  *Patient states that when the rx was last ordered the instructions were wrong. The rx should be for Sig - Route: Take 2 tablets (40 mg total) by mouth at bedtime. - Oral .

## 2022-08-08 NOTE — Telephone Encounter (Signed)
Patient requesting new Rx reflecting increase in dosage to 2 40 mg tabs daily as noted last OV note on 01/10/22.   Depression - Primary       Acute on chronic, Ideally wishes to come off and do a wash out of medication; however, given previous time off medication and on going stressor do not recommend that at this time Would recommend increase of medication, 2- 20's (40 mg) and revisit if stressors lesson in 6 months Pt is unable to meet with therapist any more frequently d/t other absences at work, however, she feels confident to meet with her as needed as they have been working together for >2.5 year

## 2022-08-09 MED ORDER — ESCITALOPRAM OXALATE 20 MG PO TABS
20.0000 mg | ORAL_TABLET | Freq: Every day | ORAL | 1 refills | Status: DC
Start: 2022-08-09 — End: 2022-11-27

## 2022-11-27 ENCOUNTER — Other Ambulatory Visit: Payer: Self-pay | Admitting: Family Medicine

## 2022-11-27 DIAGNOSIS — F331 Major depressive disorder, recurrent, moderate: Secondary | ICD-10-CM

## 2022-11-27 DIAGNOSIS — F419 Anxiety disorder, unspecified: Secondary | ICD-10-CM

## 2023-01-11 ENCOUNTER — Other Ambulatory Visit: Payer: Self-pay | Admitting: Family Medicine

## 2023-01-11 DIAGNOSIS — F331 Major depressive disorder, recurrent, moderate: Secondary | ICD-10-CM

## 2023-01-11 DIAGNOSIS — F419 Anxiety disorder, unspecified: Secondary | ICD-10-CM

## 2023-01-11 NOTE — Telephone Encounter (Signed)
Requested medications are due for refill today.  unsure  Requested medications are on the active medications list.  yes  Last refill. 11/27/2022 #90 0 rf  Future visit scheduled.   no  Notes to clinic.  Sig on request differs form sig on med list - please review.     Requested Prescriptions  Pending Prescriptions Disp Refills   escitalopram (LEXAPRO) 20 MG tablet [Pharmacy Med Name: ESCITALOPRAM 20MG  TABLETS] 180 tablet     Sig: TAKE 2 TABLETS(40 MG) BY MOUTH AT BEDTIME     Psychiatry:  Antidepressants - SSRI Failed - 01/11/2023 11:39 AM      Failed - Valid encounter within last 6 months    Recent Outpatient Visits           6 months ago Abnormal TSH   [facility] [place], [place], PA-C   1 year ago Moderate episode of recurrent major depressive disorder [facility])   [facility] Merita Norton T, FNP   1 year ago Moderate episode of recurrent major depressive disorder [facility])   [facility] Mecum, Oswaldo Conroy, PA-C   2 years ago Postpartum depression   [facility] [place], Alessandra Bevels, [place]   2 years ago Anxiety   [facility] [place], Victorino Dike M, [place]              Passed - Completed PHQ-2 or PHQ-9 in the last 360 days

## 2023-04-10 ENCOUNTER — Ambulatory Visit (INDEPENDENT_AMBULATORY_CARE_PROVIDER_SITE_OTHER): Payer: Medicaid Other | Admitting: Family Medicine

## 2023-04-10 ENCOUNTER — Encounter: Payer: Self-pay | Admitting: Family Medicine

## 2023-04-10 VITALS — BP 94/62 | HR 56 | Temp 97.8°F | Resp 12 | Ht 70.0 in | Wt 215.6 lb

## 2023-04-10 DIAGNOSIS — Z23 Encounter for immunization: Secondary | ICD-10-CM

## 2023-04-10 DIAGNOSIS — R140 Abdominal distension (gaseous): Secondary | ICD-10-CM

## 2023-04-10 DIAGNOSIS — K5909 Other constipation: Secondary | ICD-10-CM

## 2023-04-10 DIAGNOSIS — F39 Unspecified mood [affective] disorder: Secondary | ICD-10-CM | POA: Diagnosis not present

## 2023-04-10 DIAGNOSIS — K649 Unspecified hemorrhoids: Secondary | ICD-10-CM | POA: Diagnosis not present

## 2023-04-10 DIAGNOSIS — Z0001 Encounter for general adult medical examination with abnormal findings: Secondary | ICD-10-CM | POA: Diagnosis not present

## 2023-04-10 DIAGNOSIS — Z0000 Encounter for general adult medical examination without abnormal findings: Secondary | ICD-10-CM | POA: Insufficient documentation

## 2023-04-10 MED ORDER — BUSPIRONE HCL 5 MG PO TABS: 5.0000 mg | ORAL_TABLET | Freq: Three times a day (TID) | ORAL | 1 refills | Status: DC

## 2023-04-10 MED ORDER — ESCITALOPRAM OXALATE 10 MG PO TABS: ORAL_TABLET | ORAL | 0 refills | Status: DC

## 2023-04-10 NOTE — Assessment & Plan Note (Signed)
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

## 2023-04-10 NOTE — Progress Notes (Signed)
Complete physical exam  Patient: Nicole Dyer   DOB: 1991/06/25   31 y.o. Female  MRN: [id number] Visit Date: 04/10/2023  Today's healthcare provider: Jacky Kindle, FNP  Re-introduced to nurse practitioner role and practice setting.  All questions answered.  Discussed provider/patient relationship and expectations.  Chief Complaint  Patient presents with   Annual Exam   Subjective    Nicole Dyer is a 32 y.o. female who presents today for a complete physical exam.  She reports consuming a general diet. The patient does not participate in regular exercise at present. She generally feels fairly well. She reports sleeping fairly well. She does have additional problems to discuss today.   HPI   Past Medical History:  Diagnosis Date   Anxiety    Depression    Hyperthyroidism    Hypothyroidism    history resolved with first pregnancy   PCOS (polycystic ovarian syndrome)    Past Surgical History:  Procedure Laterality Date   ANKLE SURGERY Left    Social History   Socioeconomic History   Marital status: Single    Spouse name: Not on file   Number of children: 1   Years of education: Not on file   Highest education level: Not on file  Occupational History   Not on file  Tobacco Use   Smoking status: Never   Smokeless tobacco: Never  Vaping Use   Vaping status: Never Used  Substance and Sexual Activity   Alcohol use: Never   Drug use: Yes    Frequency: 3.0 times per week    Types: Marijuana   Sexual activity: Yes  Other Topics Concern   Not on file  Social History Narrative   Not on file   Social Determinants of Health   Financial Resource Strain: Not on file  Food Insecurity: Not on file  Transportation Needs: Not on file  Physical Activity: Not on file  Stress: Not on file  Social Connections: Not on file  Intimate Partner Violence: Not on file   Family Status  Relation Name Status   Mother  Alive       thyroid cancer   Father  Alive    Son  Alive  No partnership data on file   Family History  Problem Relation Age of Onset   Cancer Mother        thyroid   Cancer Father    Hypertension Father    Allergies  Allergen Reactions   Poison Ivy Extract Swelling and Rash   Bee Venom Swelling    Patient Care Team: Jacky Kindle, FNP as PCP - General (Family Medicine)   Medications: Outpatient Medications Prior to Visit  Medication Sig   acetaminophen (TYLENOL) 500 MG tablet Take 500 mg by mouth every 6 (six) hours as needed for mild pain or headache.   hydrocortisone 1 % ointment Apply 1 application topically at bedtime. (Patient taking differently: Apply 1 application  topically daily as needed for itching.)   hydrOXYzine (VISTARIL) 25 MG capsule Take 1 capsule (25 mg total) by mouth every 8 (eight) hours as needed.   ibuprofen (ADVIL) 600 MG tablet Take 1 tablet (600 mg total) by mouth every 6 (six) hours.   Multiple Vitamins-Minerals (MULTIVITAMIN ADULT) CHEW Chew by mouth.   psyllium (METAMUCIL) 58.6 % powder Take 1 packet by mouth daily.   VENTOLIN HFA 108 (90 Base) MCG/ACT inhaler SMARTSIG:1 Puff(s) By Mouth 4 Times Daily PRN   [DISCONTINUED] escitalopram (LEXAPRO) 20 MG  tablet Take 1 tablet (20 mg total) by mouth at bedtime. Please schedule office visit for additional refills   [DISCONTINUED] busPIRone (BUSPAR) 7.5 MG tablet Take 1 tablet (7.5 mg total) by mouth 2 (two) times daily.   [DISCONTINUED] famotidine (PEPCID) 20 MG tablet Take 1 tablet (20 mg total) by mouth daily.   [DISCONTINUED] metoCLOPramide (REGLAN) 10 MG tablet Take 1 tablet (10 mg total) by mouth 4 (four) times daily -  before meals and at bedtime.   No facility-administered medications prior to visit.    Review of Systems   Objective    BP 94/62 (BP Location: Left Arm, Patient Position: Sitting, Cuff Size: Large)   Pulse (!) 56   Temp 97.8 F (36.6 C) (Temporal)   Resp 12   Ht 5\' 10"  (1.778 m)   Wt 215 lb 9.6 oz (97.8 kg)   LMP  03/28/2023 (Exact Date)   SpO2 99%   BMI 30.94 kg/m   Physical Exam Vitals and nursing note reviewed.  Constitutional:      General: She is awake. She is not in acute distress.    Appearance: Normal appearance. She is well-developed and well-groomed. She is obese. She is not ill-appearing, toxic-appearing or diaphoretic.  HENT:     Head: Normocephalic and atraumatic.     Jaw: There is normal jaw occlusion. No trismus, tenderness, swelling or pain on movement.     Right Ear: Hearing, tympanic membrane, ear canal and external ear normal. There is no impacted cerumen.     Left Ear: Hearing, tympanic membrane, ear canal and external ear normal. There is no impacted cerumen.     Nose: Nose normal. No congestion or rhinorrhea.     Right Turbinates: Not enlarged, swollen or pale.     Left Turbinates: Not enlarged, swollen or pale.     Right Sinus: No maxillary sinus tenderness or frontal sinus tenderness.     Left Sinus: No maxillary sinus tenderness or frontal sinus tenderness.     Mouth/Throat:     Lips: Pink.     Mouth: Mucous membranes are moist. No injury.     Tongue: No lesions.     Pharynx: Oropharynx is clear. Uvula midline. No pharyngeal swelling, oropharyngeal exudate, posterior oropharyngeal erythema or uvula swelling.     Tonsils: No tonsillar exudate or tonsillar abscesses.  Eyes:     General: Lids are normal. Lids are everted, no foreign bodies appreciated. Vision grossly intact. Gaze aligned appropriately. No allergic shiner or visual field deficit.       Right eye: No discharge.        Left eye: No discharge.     Extraocular Movements: Extraocular movements intact.     Conjunctiva/sclera: Conjunctivae normal.     Right eye: Right conjunctiva is not injected. No exudate.    Left eye: Left conjunctiva is not injected. No exudate.    Pupils: Pupils are equal, round, and reactive to light.  Neck:     Thyroid: No thyroid mass, thyromegaly or thyroid tenderness.     Vascular:  No carotid bruit.     Trachea: Trachea normal.  Cardiovascular:     Rate and Rhythm: Normal rate and regular rhythm.     Pulses: Normal pulses.          Carotid pulses are 2+ on the right side and 2+ on the left side.      Radial pulses are 2+ on the right side and 2+ on the left side.  Dorsalis pedis pulses are 2+ on the right side and 2+ on the left side.       Posterior tibial pulses are 2+ on the right side and 2+ on the left side.     Heart sounds: Normal heart sounds, S1 normal and S2 normal. No murmur heard.    No friction rub. No gallop.  Pulmonary:     Effort: Pulmonary effort is normal. No respiratory distress.     Breath sounds: Normal breath sounds and air entry. No stridor. No wheezing, rhonchi or rales.  Chest:     Chest wall: No tenderness.  Abdominal:     General: Abdomen is flat. Bowel sounds are normal. There is no distension.     Palpations: Abdomen is soft. There is no mass.     Tenderness: There is no abdominal tenderness. There is no right CVA tenderness, left CVA tenderness, guarding or rebound.     Hernia: No hernia is present.  Genitourinary:    Comments: Exam deferred; denies complaints Musculoskeletal:        General: No swelling, tenderness, deformity or signs of injury. Normal range of motion.     Cervical back: Full passive range of motion without pain, normal range of motion and neck supple. No edema, rigidity or tenderness. No muscular tenderness.     Right lower leg: No edema.     Left lower leg: No edema.  Lymphadenopathy:     Cervical: No cervical adenopathy.     Right cervical: No superficial, deep or posterior cervical adenopathy.    Left cervical: No superficial, deep or posterior cervical adenopathy.  Skin:    General: Skin is warm and dry.     Capillary Refill: Capillary refill takes less than 2 seconds.     Coloration: Skin is not jaundiced or pale.     Findings: No bruising, erythema, lesion or rash.  Neurological:     General: No  focal deficit present.     Mental Status: She is alert and oriented to person, place, and time. Mental status is at baseline.     GCS: GCS eye subscore is 4. GCS verbal subscore is 5. GCS motor subscore is 6.     Sensory: Sensation is intact. No sensory deficit.     Motor: Motor function is intact. No weakness.     Coordination: Coordination is intact. Coordination normal.     Gait: Gait is intact. Gait normal.  Psychiatric:        Attention and Perception: Attention and perception normal.        Mood and Affect: Mood and affect normal.        Speech: Speech normal.        Behavior: Behavior normal. Behavior is cooperative.        Thought Content: Thought content normal.        Cognition and Memory: Cognition and memory normal.        Judgment: Judgment normal.     Last depression screening scores    04/10/2023   10:14 AM 07/03/2022   11:17 AM 01/10/2022    4:06 PM  PHQ 2/9 Scores  PHQ - 2 Score 2 2 1   PHQ- 9 Score 12 10 8    Last fall risk screening    04/10/2023   10:14 AM  Fall Risk   Falls in the past year? 0  Number falls in past yr: 0  Injury with Fall? 0  Risk for fall due to : No Fall Risks  Follow up Falls evaluation completed   Last Audit-C alcohol use screening    07/03/2022   11:18 AM  Alcohol Use Disorder Test (AUDIT)  1. How often do you have a drink containing alcohol? 2  2. How many drinks containing alcohol do you have on a typical day when you are drinking? 0  3. How often do you have six or more drinks on one occasion? 0  AUDIT-C Score 2   A score of 3 or more in women, and 4 or more in men indicates increased risk for alcohol abuse, EXCEPT if all of the points are from question 1   No results found for any visits on 04/10/23.  Assessment & Plan    Routine Health Maintenance and Physical Exam  Exercise Activities and Dietary recommendations  Goals   None     Immunization History  Administered Date(s) Administered   Influenza,inj,Quad PF,6+  Mos 06/01/2019, 10/10/2021   Td 03/04/2019   Tdap 03/04/2019    Health Maintenance  Topic Date Due   Hepatitis C Screening  Never done   PAP SMEAR-Modifier  Never done   COVID-19 Vaccine (1 - 2023-24 season) Never done   INFLUENZA VACCINE  11/04/2023 (Originally 03/07/2023)   DTaP/Tdap/Td (3 - Td or Tdap) 03/03/2029   HIV Screening  Completed   HPV VACCINES  Aged Out    Discussed health benefits of physical activity, and encouraged her to engage in regular exercise appropriate for her age and condition.  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  - Eat a low-fat diet with lots of fruits and vegetables, up to 7-9 servings per day.  - Seatbelts can save your life. Wear them always.  - Smoke detectors on every level of your home, check batteries every year.  - Eye Doctor - have an eye exam every 1-2  years  - Safe sex - if you may be exposed to STDs, use a condom.  - Alcohol -  If you drink, do it moderately, less than 2 drinks per day.  - Health Care Power of Attorney. Choose someone to speak for you if you are not able.  - Depression is common in our stressful world.If you're feeling down or losing interest in things you normally enjoy, please come in for a visit.  - Violence - If anyone is threatening or hurting you, please call immediately. Repeat  labs today       Relevant Orders   CBC with Differential/Platelet   Comprehensive Metabolic Panel (CMET)   TSH   Hepatitis C Antibody   Lipid panel   Hemoglobin A1c   Mood disorder (HCC)    Acute on chronic, "improved" reports previous self wean of medication to assist with complications from arthritis complaints to l ankle and desire to use NSAIDs Additional Buspar provided to assist with panic attacks and 10 mg tablets provided to assist with pt guided wean as she resumed counseling       Relevant Medications   busPIRone (BUSPAR) 5 MG tablet   escitalopram (LEXAPRO) 10 MG tablet   Return in about 6 weeks (around 05/22/2023) for anxiety and depression.    Leilani Merl, FNP, have reviewed all documentation for this visit. The documentation on 04/10/23 for the exam, diagnosis, procedures, and orders are all accurate and complete.  Jacky Kindle, FNP  [facility] [phone] (phone) [phone] (fax)  [facility]

## 2023-04-10 NOTE — Assessment & Plan Note (Signed)
Acute on chronic, "improved" reports previous self wean of medication to assist with complications from arthritis complaints to l ankle and desire to use NSAIDs Additional Buspar provided to assist with panic attacks and 10 mg tablets provided to assist with pt guided wean as she resumed counseling

## 2023-04-10 NOTE — Assessment & Plan Note (Signed)
UTD on dental Things to do to keep yourself healthy  - Exercise at least 30-45 minutes a day, 3-4 days a week.  - Eat a low-fat diet with lots of fruits and vegetables, up to 7-9 servings per day.  - Seatbelts can save your life. Wear them always.  - Smoke detectors on every level of your home, check batteries every year.  - Eye Doctor - have an eye exam every 1-2 years  - Safe sex - if you may be exposed to STDs, use a condom.  - Alcohol -  If you drink, do it moderately, less than 2 drinks per day.  - Health Care Power of Attorney. Choose someone to speak for you if you are not able.  - Depression is common in our stressful world.If you're feeling down or losing interest in things you normally enjoy, please come in for a visit.  - Violence - If anyone is threatening or hurting you, please call immediately. Repeat labs today

## 2023-04-11 LAB — CBC WITH DIFFERENTIAL/PLATELET
Basophils Absolute: 0 10*3/uL (ref 0.0–0.2)
Basos: 1 %
EOS (ABSOLUTE): 0.7 10*3/uL — ABNORMAL HIGH (ref 0.0–0.4)
Eos: 10 %
Hematocrit: 41.1 % (ref 34.0–46.6)
Hemoglobin: 13.1 g/dL (ref 11.1–15.9)
Immature Grans (Abs): 0 10*3/uL (ref 0.0–0.1)
Immature Granulocytes: 0 %
Lymphocytes Absolute: 2.1 10*3/uL (ref 0.7–3.1)
Lymphs: 30 %
MCH: 27.3 pg (ref 26.6–33.0)
MCHC: 31.9 g/dL (ref 31.5–35.7)
MCV: 86 fL (ref 79–97)
Monocytes Absolute: 0.5 10*3/uL (ref 0.1–0.9)
Monocytes: 7 %
Neutrophils Absolute: 3.6 10*3/uL (ref 1.4–7.0)
Neutrophils: 52 %
Platelets: 287 10*3/uL (ref 150–450)
RBC: 4.8 x10E6/uL (ref 3.77–5.28)
RDW: 12.7 % (ref 11.7–15.4)
WBC: 6.8 10*3/uL (ref 3.4–10.8)

## 2023-04-11 LAB — COMPREHENSIVE METABOLIC PANEL
ALT: 16 IU/L (ref 0–32)
AST: 15 IU/L (ref 0–40)
Albumin: 4.4 g/dL (ref 3.9–4.9)
Alkaline Phosphatase: 96 IU/L (ref 44–121)
BUN/Creatinine Ratio: 9 (ref 9–23)
BUN: 7 mg/dL (ref 6–20)
Bilirubin Total: 0.3 mg/dL (ref 0.0–1.2)
CO2: 27 mmol/L (ref 20–29)
Calcium: 9.4 mg/dL (ref 8.7–10.2)
Chloride: 103 mmol/L (ref 96–106)
Creatinine, Ser: 0.78 mg/dL (ref 0.57–1.00)
Globulin, Total: 2.6 g/dL (ref 1.5–4.5)
Glucose: 68 mg/dL — ABNORMAL LOW (ref 70–99)
Potassium: 4 mmol/L (ref 3.5–5.2)
Sodium: 142 mmol/L (ref 134–144)
Total Protein: 7 g/dL (ref 6.0–8.5)
eGFR: 104 mL/min/{1.73_m2} (ref >59)

## 2023-04-11 LAB — HEMOGLOBIN A1C
Est. average glucose Bld gHb Est-mCnc: 111 mg/dL
Hgb A1c MFr Bld: 5.5 % (ref 4.8–5.6)

## 2023-04-11 LAB — LIPID PANEL
Chol/HDL Ratio: 4.5 ratio — ABNORMAL HIGH (ref 0.0–4.4)
Cholesterol, Total: 158 mg/dL (ref 100–199)
HDL: 35 mg/dL — ABNORMAL LOW (ref >39)
LDL Chol Calc (NIH): 107 mg/dL — ABNORMAL HIGH (ref 0–99)
Triglycerides: 85 mg/dL (ref 0–149)
VLDL Cholesterol Cal: 16 mg/dL (ref 5–40)

## 2023-04-11 LAB — TSH: TSH: 0.624 u[IU]/mL (ref 0.450–4.500)

## 2023-04-11 LAB — HEPATITIS C ANTIBODY: Hep C Virus Ab: NONREACTIVE

## 2023-05-22 ENCOUNTER — Ambulatory Visit (INDEPENDENT_AMBULATORY_CARE_PROVIDER_SITE_OTHER): Payer: Medicaid Other | Admitting: Family Medicine

## 2023-05-22 DIAGNOSIS — Z91199 Patient's noncompliance with other medical treatment and regimen due to unspecified reason: Secondary | ICD-10-CM

## 2023-05-22 NOTE — Progress Notes (Signed)
Patient was not seen for appt d/t no call, no show, or late arrival >10 mins past appt time.   Elise T Payne, FNP  [facility] [address] [place], [place] [address] [phone] (phone) [phone] (fax) [facility]  

## 2023-06-21 ENCOUNTER — Other Ambulatory Visit: Payer: Self-pay

## 2023-06-24 ENCOUNTER — Encounter: Payer: Self-pay | Admitting: Gastroenterology

## 2023-06-24 ENCOUNTER — Ambulatory Visit (INDEPENDENT_AMBULATORY_CARE_PROVIDER_SITE_OTHER): Payer: Medicaid Other | Admitting: Gastroenterology

## 2023-06-24 VITALS — BP 113/81 | HR 98 | Temp 98.2°F | Ht 70.0 in | Wt 215.1 lb

## 2023-06-24 DIAGNOSIS — K625 Hemorrhage of anus and rectum: Secondary | ICD-10-CM | POA: Diagnosis not present

## 2023-06-24 DIAGNOSIS — Z8719 Personal history of other diseases of the digestive system: Secondary | ICD-10-CM | POA: Diagnosis not present

## 2023-06-24 DIAGNOSIS — K640 First degree hemorrhoids: Secondary | ICD-10-CM | POA: Diagnosis not present

## 2023-06-24 DIAGNOSIS — K6289 Other specified diseases of anus and rectum: Secondary | ICD-10-CM

## 2023-06-24 NOTE — Progress Notes (Signed)
PROCEDURE NOTE: The patient presents with symptomatic grade 1 hemorrhoids, unresponsive to maximal medical therapy, requesting rubber band ligation of his/her hemorrhoidal disease.  All risks, benefits and alternative forms of therapy were described and informed consent was obtained.  In the Left Lateral Decubitus position (if anoscopy is performed) anoscopic examination revealed grade 1 hemorrhoids in the all position(s).   The decision was made to band the RP internal hemorrhoid, and the [place] was used to perform band ligation without complication.  Digital anorectal examination was then performed to assure proper positioning of the band, and to adjust the banded tissue as required.  The patient was discharged home without pain or other issues.  Dietary and behavioral recommendations were given and (if necessary - prescriptions were given), along with follow-up instructions.  The patient will return 4 weeks for follow-up and possible additional banding as required.  No complications were encountered and the patient tolerated the procedure well.

## 2023-06-24 NOTE — Progress Notes (Signed)
Nicole Repress, MD [address]  [address]  [place], [place] [address]  Main: [phone]  Fax: [phone]    Gastroenterology Consultation  Referring Provider:     Jacky Kindle, FNP Primary Care Physician:  Nicole Kindle, FNP Primary Gastroenterologist:  Dr. Arlyss Dyer Reason for Consultation: Symptomatic hemorrhoids        HPI:   Nicole Dyer is a 32 y.o. female referred by Nicole Kindle, FNP  for consultation & management of symptomatic hemorrhoids.  Patient states that ever since she was pregnant with her first child 4 years ago, she has been experiencing frequent, intermittent symptoms of rectal bleeding on wiping, bright red blood and sometimes dripping into the toilet associated with rectal pressure, pain, swelling, discomfort, pressure, itching.  She reports episodes of constipation and diarrhea.  He takes fiber supplement regularly.  Denies any weight loss, abdominal pain.  She is a single mom taking care of 30 kids 61-year old and 26-year-old.  She is also an Print production planner.  She has tried several over-the-counter hemorrhoidal creams which provide temporary relief but her symptoms recurred  Does not smoke, occasional alcohol use She denies any family history of GI malignancy, IBD  NSAIDs: None  Antiplts/Anticoagulants/Anti thrombotics: None  GI Procedures: None  Past Medical History:  Diagnosis Date   Anxiety    Depression    Hyperthyroidism    Hypothyroidism    history resolved with first pregnancy   PCOS (polycystic ovarian syndrome)     Past Surgical History:  Procedure Laterality Date   ANKLE SURGERY Left      Current Outpatient Medications:    acetaminophen (TYLENOL) 500 MG tablet, Take 500 mg by mouth every 6 (six) hours as needed for mild pain or headache., Disp: , Rfl:    escitalopram (LEXAPRO) 10 MG tablet, 2 tablets daily x 3 weeks; 1 tablet daily x 3 weeks; 0.5 tablet daily until finished. As tolerated for SSRI wean., Disp: 90  tablet, Rfl: 0   hydrocortisone 1 % ointment, Apply 1 application topically at bedtime. (Patient taking differently: Apply 1 application  topically daily as needed for itching.), Disp: 30 g, Rfl: 0   ibuprofen (ADVIL) 600 MG tablet, Take 1 tablet (600 mg total) by mouth every 6 (six) hours., Disp: 30 tablet, Rfl: 0   Multiple Vitamins-Minerals (MULTIVITAMIN ADULT) CHEW, Chew by mouth., Disp: , Rfl:    psyllium (METAMUCIL) 58.6 % powder, Take 1 packet by mouth daily., Disp: 283 g, Rfl: 0   VENTOLIN HFA 108 (90 Base) MCG/ACT inhaler, SMARTSIG:1 Puff(s) By Mouth 4 Times Daily PRN, Disp: , Rfl:    Family History  Problem Relation Age of Onset   Cancer Mother        thyroid   Cancer Father    Hypertension Father      Social History   Tobacco Use   Smoking status: Never   Smokeless tobacco: Never  Vaping Use   Vaping status: Never Used  Substance Use Topics   Alcohol use: Never   Drug use: Yes    Frequency: 3.0 times per week    Types: Marijuana    Allergies as of 06/24/2023 - Review Complete 06/24/2023  Allergen Reaction Noted   Poison ivy extract Swelling and Rash 04/01/2019   Bee venom Swelling 05/04/2019    Review of Systems:    All systems reviewed and negative except where noted in HPI.   Physical Exam:  BP 113/81 (BP Location: Left Arm,  Patient Position: Sitting, Cuff Size: Normal)   Pulse 98   Temp 98.2 F (36.8 C) (Oral)   Ht 5\' 10"  (1.778 m)   Wt 215 lb 2 oz (97.6 kg)   BMI 30.87 kg/m  No LMP recorded.  General:   Alert,  Well-developed, well-nourished, pleasant and cooperative in NAD Head:  Normocephalic and atraumatic. Eyes:  Sclera clear, no icterus.   Conjunctiva pink. Ears:  Normal auditory acuity. Nose:  No deformity, discharge, or lesions. Mouth:  No deformity or lesions,oropharynx pink & moist. Neck:  Supple; no masses or thyromegaly. Lungs:  Respirations even and unlabored.  Clear throughout to auscultation.   No wheezes, crackles, or rhonchi. No  acute distress. Heart:  Regular rate and rhythm; no murmurs, clicks, rubs, or gallops. Abdomen:  Normal bowel sounds. Soft, non-tender and non-distended without masses, hepatosplenomegaly or hernias noted.  No guarding or rebound tenderness.   Rectal: Small, soft perianal skin tags, nontender digital rectal exam Msk:  Symmetrical without gross deformities. Good, equal movement & strength bilaterally. Pulses:  Normal pulses noted. Extremities:  No clubbing or edema.  No cyanosis. Neurologic:  Alert and oriented x3;  grossly normal neurologically. Skin:  Intact without significant lesions or rashes. No jaundice. Psych:  Alert and cooperative. Normal mood and affect.  Imaging Studies: None  Assessment and Plan:   Nicole Dyer is a 32 y.o. pleasant Caucasian female with approximately 4 years history of symptoms of bright red blood per rectum on wiping and dripping into the toilet bowel, rectal pressure, pain, swelling, itching, history of intermittent constipation with straining.  Her symptoms are consistent with grade 1 symptomatic hemorrhoids despite medical therapy  Discussed about high-fiber diet, adequate intake of water Discussed about outpatient hemorrhoid ligation, procedure, risks and benefits Consent obtained Proceed with hemorrhoid ligation today   Follow up in 3 to 4 weeks   Nicole Repress, MD

## 2023-07-05 ENCOUNTER — Other Ambulatory Visit: Payer: Self-pay | Admitting: Family Medicine

## 2023-07-05 DIAGNOSIS — F39 Unspecified mood [affective] disorder: Secondary | ICD-10-CM

## 2023-07-09 NOTE — Telephone Encounter (Signed)
Requested medications are due for refill today.  no  Requested medications are on the active medications list.  yes  Last refill. 04/10/2023 #90 0 rf  Future visit scheduled.   no  Notes to clinic.  Pt was to wean off of this medication. Please advise.    Requested Prescriptions  Pending Prescriptions Disp Refills   escitalopram (LEXAPRO) 10 MG tablet [Pharmacy Med Name: ESCITALOPRAM 10MG  TABLETS] 90 tablet 0    Sig: TAKE 2 TABLET BY MOUTH DAILY FOR 3 WEEKS, 1 DAILY FOR 3 WEEKS THEN 1/2 TABLET DAILY UNTIL FINISHED AS TOLERATED FOR SSRI     Psychiatry:  Antidepressants - SSRI Passed - 07/05/2023  6:24 AM      Passed - Valid encounter within last 6 months    Recent Outpatient Visits           1 month ago No-show for appointment   [facility] Jacky Kindle, FNP   3 months ago Annual physical exam   [facility] Jacky Kindle, FNP   1 year ago Abnormal TSH   [place] [facility] [place], [place], PA-C   1 year ago Moderate episode of recurrent major depressive disorder [facility])   [place] Merita Norton T, FNP   1 year ago Moderate episode of recurrent major depressive disorder [facility])   [facility] [place] [facility] M[place], Oswaldo Conroy, PA-C       Future Appointments             In 1 week Vanga, Loel Dubonnet, MD [facility]

## 2023-07-10 ENCOUNTER — Other Ambulatory Visit: Payer: Self-pay

## 2023-07-18 ENCOUNTER — Ambulatory Visit: Payer: Medicaid Other | Admitting: Gastroenterology

## 2023-07-18 ENCOUNTER — Encounter: Payer: Self-pay | Admitting: Gastroenterology

## 2023-07-18 VITALS — BP 113/72 | HR 69 | Temp 98.1°F | Ht 70.0 in | Wt 217.1 lb

## 2023-07-18 DIAGNOSIS — K640 First degree hemorrhoids: Secondary | ICD-10-CM

## 2023-07-18 NOTE — Progress Notes (Signed)
PROCEDURE NOTE: The patient presents with symptomatic grade 1 hemorrhoids, unresponsive to maximal medical therapy, requesting rubber band ligation of his/her hemorrhoidal disease.  All risks, benefits and alternative forms of therapy were described and informed consent was obtained.  The decision was made to band the RA internal hemorrhoid, and the [place] was used to perform band ligation without complication.  Digital anorectal examination was then performed to assure proper positioning of the band, and to adjust the banded tissue as required.  The patient was discharged home without pain or other issues.  Dietary and behavioral recommendations were given and (if necessary - prescriptions were given), along with follow-up instructions.  The patient will return 4 weeks for follow-up and possible additional banding as required.  No complications were encountered and the patient tolerated the procedure well.

## 2023-09-10 ENCOUNTER — Ambulatory Visit: Payer: Medicaid Other | Admitting: Family Medicine

## 2023-09-10 ENCOUNTER — Ambulatory Visit: Payer: Self-pay | Admitting: Physician Assistant

## 2023-09-10 NOTE — Progress Notes (Deleted)
 Established patient visit  Patient: Nicole Dyer   DOB: 12/27/1990   33 y.o. Female  MRN: [id number] Visit Date: 09/10/2023  Today's healthcare provider: Jolynn Spencer, PA-C   No chief complaint on file.  Subjective       Discussed the use of AI scribe software for clinical note transcription with the patient, who gave verbal consent to proceed.  History of Present Illness               04/10/2023   10:14 AM 07/03/2022   11:17 AM 01/10/2022    4:06 PM  Depression screen PHQ 2/9  Decreased Interest 1 1 0  Down, Depressed, Hopeless 1 1 1   PHQ - 2 Score 2 2 1   Altered sleeping 3 2 2   Tired, decreased energy 3 2 2   Change in appetite 2 2 2   Feeling bad or failure about yourself  1 0 0  Trouble concentrating 1 1 1   Moving slowly or fidgety/restless 0 0 0  Suicidal thoughts 0 1 0  PHQ-9 Score 12 10 8   Difficult doing work/chores Somewhat difficult Not difficult at all Somewhat difficult      04/10/2023   10:14 AM 10/10/2021    3:41 PM 12/18/2019    2:50 PM  GAD 7 : Generalized Anxiety Score  Nervous, Anxious, on Edge 2 2 1   Control/stop worrying 1 2 1   Worry too much - different things 1 3 2   Trouble relaxing 3 3 1   Restless 2 2 2   Easily annoyed or irritable 2 3 2   Afraid - awful might happen 1 0 0  Total GAD 7 Score 12 15 9   Anxiety Difficulty Somewhat difficult Somewhat difficult Somewhat difficult    Medications: Outpatient Medications Prior to Visit  Medication Sig   acetaminophen  (TYLENOL ) 500 MG tablet Take 500 mg by mouth every 6 (six) hours as needed for mild pain or headache.   escitalopram  (LEXAPRO ) 20 MG tablet Take 20 mg by mouth daily.   hydrocortisone  1 % ointment Apply 1 application topically at bedtime. (Patient taking differently: Apply 1 application  topically daily as needed for itching.)   ibuprofen  (ADVIL ) 600 MG tablet Take 1 tablet (600 mg total) by mouth every 6 (six) hours.   Multiple Vitamins-Minerals (MULTIVITAMIN ADULT) CHEW Chew by  mouth.   psyllium (METAMUCIL) 58.6 % powder Take 1 packet by mouth daily.   tretinoin (RETIN-A) 0.025 % cream Apply topically at bedtime.   VENTOLIN HFA 108 (90 Base) MCG/ACT inhaler SMARTSIG:1 Puff(s) By Mouth 4 Times Daily PRN   No facility-administered medications prior to visit.    Review of Systems All negative Except see HPI   {Insert previous labs (optional):23779} {See past labs  Heme  Chem  Endocrine  Serology  Results Review (optional):1}   Objective    There were no vitals taken for this visit. {Insert last BP/Wt (optional):23777}{See vitals history (optional):1}   Physical Exam   No results found for any visits on 09/10/23.      Assessment and Plan             No orders of the defined types were placed in this encounter.   No follow-ups on file.   The patient was advised to call back or seek an in-person evaluation if the symptoms worsen or if the condition fails to improve as anticipated.  I discussed the assessment and treatment plan with the patient. The patient was provided an opportunity to ask questions and all were answered. The  patient agreed with the plan and demonstrated an understanding of the instructions.  I, Leston Schueller, PA-C have reviewed all documentation for this visit. The documentation on 09/10/2023  for the exam, diagnosis, procedures, and orders are all accurate and complete.  Jolynn Spencer, [facility], MMS [facility] [phone] (phone) [phone] (fax)  [facility]

## 2023-09-11 ENCOUNTER — Encounter: Payer: Self-pay | Admitting: Gastroenterology

## 2023-09-11 ENCOUNTER — Ambulatory Visit (INDEPENDENT_AMBULATORY_CARE_PROVIDER_SITE_OTHER): Payer: Medicaid Other | Admitting: Gastroenterology

## 2023-09-11 VITALS — BP 106/69 | HR 57 | Temp 97.7°F | Ht 70.0 in | Wt 216.1 lb

## 2023-09-11 DIAGNOSIS — K640 First degree hemorrhoids: Secondary | ICD-10-CM

## 2023-09-11 NOTE — Progress Notes (Signed)
PROCEDURE NOTE: The patient presents with symptomatic grade 1 hemorrhoids, unresponsive to maximal medical therapy, requesting rubber band ligation of his/her hemorrhoidal disease.  All risks, benefits and alternative forms of therapy were described and informed consent was obtained.  The decision was made to band the LL internal hemorrhoid, and the [facility] System was used to perform band ligation without complication.  Digital anorectal examination was then performed to assure proper positioning of the band, and to adjust the banded tissue as required.  The patient was discharged home without pain or other issues.  Dietary and behavioral recommendations were given and (if necessary - prescriptions were given), along with follow-up instructions.  The patient will return as needed for follow-up and possible additional banding as required.  No complications were encountered and the patient tolerated the procedure well.
